# Patient Record
Sex: Female | Born: 1985 | Race: White | Hispanic: No | Marital: Married | Smoking: Never smoker
Health system: Southern US, Community
[De-identification: ages and names within clinical notes are randomized; demographics above are authoritative.]

## PROBLEM LIST (undated history)

## (undated) ENCOUNTER — Inpatient Hospital Stay: Payer: Self-pay

## (undated) DIAGNOSIS — D649 Anemia, unspecified: Secondary | ICD-10-CM

## (undated) DIAGNOSIS — A6009 Herpesviral infection of other urogenital tract: Secondary | ICD-10-CM

## (undated) DIAGNOSIS — E669 Obesity, unspecified: Secondary | ICD-10-CM

## (undated) HISTORY — DX: Herpesviral infection of other urogenital tract: A60.09

## (undated) HISTORY — DX: Anemia, unspecified: D64.9

## (undated) HISTORY — PX: NO PAST SURGERIES: SHX2092

---

## 2016-12-14 ENCOUNTER — Ambulatory Visit (INDEPENDENT_AMBULATORY_CARE_PROVIDER_SITE_OTHER): Payer: Managed Care, Other (non HMO) | Admitting: Certified Nurse Midwife

## 2016-12-14 ENCOUNTER — Encounter: Payer: Self-pay | Admitting: Certified Nurse Midwife

## 2016-12-14 VITALS — BP 105/64 | HR 78 | Ht 64.0 in | Wt 306.1 lb

## 2016-12-14 DIAGNOSIS — Z3202 Encounter for pregnancy test, result negative: Secondary | ICD-10-CM | POA: Diagnosis not present

## 2016-12-14 DIAGNOSIS — N926 Irregular menstruation, unspecified: Secondary | ICD-10-CM | POA: Diagnosis not present

## 2016-12-14 LAB — POCT URINE PREGNANCY: PREG TEST UR: NEGATIVE

## 2016-12-14 NOTE — Progress Notes (Signed)
Pt is here with c/o amenorrhea. Neg home pregnancy test. C/o breast heaviest and soreness. Denies N/V. LPS 8/17 WNL.

## 2016-12-14 NOTE — Patient Instructions (Signed)
Secondary Amenorrhea Secondary amenorrhea is the stopping of menstrual flow for 3-6 months in a female who has previously had periods. There are many possible causes. Most of these causes are not serious. Usually, treating the underlying problem causing the loss of menses will return your periods to normal. What are the causes? Some common and uncommon causes of not menstruating include:  Malnutrition.  Low blood sugar (hypoglycemia).  Polycystic ovary disease.  Stress or fear.  Breastfeeding.  Hormone imbalance.  Ovarian failure.  Medicines.  Extreme obesity.  Cystic fibrosis.  Low body weight or drastic weight reduction from any cause.  Early menopause.  Removal of ovaries or uterus.  Contraceptives.  Illness.  Long-term (chronic) illnesses.  Cushing syndrome.  Thyroid problems.  Birth control pills, patches, or vaginal rings for birth control.  What increases the risk? You may be at greater risk of secondary amenorrhea if:  You have a family history of this condition.  You have an eating disorder.  You do athletic training.  How is this diagnosed? A diagnosis is made by your health care provider taking a medical history and doing a physical exam. This will include a pelvic exam to check for problems with your reproductive organs. Pregnancy must be ruled out. Often, numerous blood tests are done to measure different hormones in the body. Urine testing may be done. Specialized exams (ultrasound, CT scan, MRI, or hysteroscopy) may have to be done as well as measuring the body mass index (BMI). How is this treated? Treatment depends on the cause of the amenorrhea. If an eating disorder is present, this can be treated with an adequate diet and therapy. Chronic illnesses may improve with treatment of the illness. Amenorrhea may be corrected with medicines, lifestyle changes, or surgery. If the amenorrhea cannot be corrected, it is sometimes possible to create a  false menstruation with medicines. Follow these instructions at home:  Maintain a healthy diet.  Manage weight problems.  Exercise regularly but not excessively.  Get adequate sleep.  Manage stress.  Be aware of changes in your menstrual cycle. Keep a record of when your periods occur. Note the date your period starts, how long it lasts, and any problems. Contact a health care provider if: Your symptoms do not get better with treatment. This information is not intended to replace advice given to you by your health care provider. Make sure you discuss any questions you have with your health care provider. Document Released: 08/13/2006 Document Revised: 12/08/2015 Document Reviewed: 12/18/2012 Elsevier Interactive Patient Education  2018 Elsevier Inc.  

## 2016-12-16 LAB — COMPREHENSIVE METABOLIC PANEL
A/G RATIO: 1.3 (ref 1.2–2.2)
ALT: 10 IU/L (ref 0–32)
AST: 15 IU/L (ref 0–40)
Albumin: 4 g/dL (ref 3.5–5.5)
Alkaline Phosphatase: 82 IU/L (ref 39–117)
BILIRUBIN TOTAL: 0.3 mg/dL (ref 0.0–1.2)
BUN/Creatinine Ratio: 21 (ref 9–23)
BUN: 15 mg/dL (ref 6–20)
CHLORIDE: 103 mmol/L (ref 96–106)
CO2: 25 mmol/L (ref 18–29)
Calcium: 9.1 mg/dL (ref 8.7–10.2)
Creatinine, Ser: 0.72 mg/dL (ref 0.57–1.00)
GFR calc non Af Amer: 114 mL/min/{1.73_m2} (ref >59)
GFR, EST AFRICAN AMERICAN: 131 mL/min/{1.73_m2} (ref >59)
GLUCOSE: 92 mg/dL (ref 65–99)
Globulin, Total: 3.1 g/dL (ref 1.5–4.5)
Potassium: 4.2 mmol/L (ref 3.5–5.2)
Sodium: 141 mmol/L (ref 134–144)
TOTAL PROTEIN: 7.1 g/dL (ref 6.0–8.5)

## 2016-12-16 LAB — TESTOSTERONE, FREE, TOTAL, SHBG
SEX HORMONE BINDING: 40.1 nmol/L (ref 24.6–122.0)
TESTOSTERONE FREE: 1.7 pg/mL (ref 0.0–4.2)
TESTOSTERONE: 19 ng/dL (ref 8–48)

## 2016-12-16 LAB — LIPID PANEL
Chol/HDL Ratio: 3.1 ratio (ref 0.0–4.4)
Cholesterol, Total: 166 mg/dL (ref 100–199)
HDL: 53 mg/dL (ref >39)
LDL Calculated: 96 mg/dL (ref 0–99)
Triglycerides: 86 mg/dL (ref 0–149)
VLDL Cholesterol Cal: 17 mg/dL (ref 5–40)

## 2016-12-16 LAB — HEMOGLOBIN A1C
ESTIMATED AVERAGE GLUCOSE: 108 mg/dL
HEMOGLOBIN A1C: 5.4 % (ref 4.8–5.6)

## 2016-12-16 LAB — BETA HCG QUANT (REF LAB): hCG Quant: <1 m[IU]/mL

## 2016-12-16 LAB — PROLACTIN: PROLACTIN: 12.6 ng/mL (ref 4.8–23.3)

## 2016-12-16 LAB — FSH/LH
FSH: 6.2 m[IU]/mL
LH: 6.9 m[IU]/mL

## 2016-12-16 LAB — INSULIN, RANDOM: INSULIN: 21.2 u[IU]/mL (ref 2.6–24.9)

## 2016-12-16 LAB — TSH: TSH: 1.64 u[IU]/mL (ref 0.450–4.500)

## 2016-12-16 NOTE — Progress Notes (Signed)
GYN ENCOUNTER NOTE  Subjective:       Nancy Sloan is a 31 y.o. G70P1001 female here for evaluation of missed menses with negative urine pregnancy test.   Nancy Sloan and her husband have been trying to get pregnancy for the last few months.   She endorses breast tenderness and back cramping, but had a negative home pregnancy test.   She started a low carbohydrate diet in January and lost 40 pounds, but has gained 10-15 pounds back in the last few months.   She believes she saw blood when wiping today after leaving her urine sample and thinks her cycle might be starting.   Denies difficulty breathing or respiratory distress, chest pain, headache, heat/cold intolerance, abdominal pain, dysuria, and leg pain or swelling.     Gynecologic History  Patient's last menstrual period was 09/20/2016 (exact date).  Contraception: none  Last Pap: 02/2016. Results were: normal  Menstrual History  Period Cycle (Days): 35-40 Period Duration (Days): 4-5 Period Pattern: Regular Menstrual Flow: Light, Moderate Dysmenorrhea: (!) Mild Dysmenorrhea Symptoms: start the week before  Obstetric History  OB History  Gravida Para Term Preterm AB Living  1 1 1     1   SAB TAB Ectopic Multiple Live Births          1    # Outcome Date GA Lbr Len/2nd Weight Sex Delivery Anes PTL Lv  1 Term 2013    F Vag-Spont  N LIV      History reviewed. No pertinent past medical history.  History reviewed. No pertinent surgical history.   Social History   Social History  . Marital status: Married    Spouse name: N/A  . Number of children: N/A  . Years of education: N/A   Occupational History  . Not on file.   Social History Main Topics  . Smoking status: Never Smoker  . Smokeless tobacco: Never Used  . Alcohol use Yes     Comment: occas  . Drug use: No  . Sexual activity: Yes    Birth control/ protection: None   Other Topics Concern  . Not on file   Social History Narrative  . No  narrative on file    Family History  Problem Relation Age of Onset  . Thyroid disease Mother   . Cancer - Cervical Mother     The following portions of the patient's history were reviewed and updated as appropriate: allergies, current medications, past family history, past medical history, past social history, past surgical history and problem list.  Review of Systems  Review of Systems - Negative except as noted above.  History obtained from the patient.  Objective:   BP 105/64   Pulse 78   Ht 5\' 4"  (1.626 m)   Wt (!) 306 lb 2 oz (138.9 kg)   LMP 09/20/2016 (Exact Date)   BMI 52.55 kg/m   Alert and oriented x 4, no apparent distress  Negative UPT  Physical exam: not indicated  Assessment:   1. Missed menses  - POCT urine pregnancy - Beta HCG, Quant - TSH - Prolactin - Comprehensive metabolic panel - Hemoglobin A1C - Lipid panel - Insulin, random - FSH/LH - Testosterone, Free, Total, SHBG  2. Urine pregnancy test negative  Plan:   Labs, see orders; Will contact pt via MyChart with results.   Reviewed red flag symptoms and when to call.   RTC as needed.    Gunnar Bulla, CNM  A total of 20 minutes were  spent face-to-face with the patient during the encounter with greater than 50% dealing with counseling and coordination of care.

## 2017-02-19 ENCOUNTER — Ambulatory Visit (INDEPENDENT_AMBULATORY_CARE_PROVIDER_SITE_OTHER): Payer: Managed Care, Other (non HMO)

## 2017-02-19 ENCOUNTER — Encounter: Payer: Self-pay | Admitting: Certified Nurse Midwife

## 2017-02-19 ENCOUNTER — Other Ambulatory Visit: Payer: Self-pay | Admitting: Certified Nurse Midwife

## 2017-02-19 ENCOUNTER — Ambulatory Visit (INDEPENDENT_AMBULATORY_CARE_PROVIDER_SITE_OTHER): Payer: Managed Care, Other (non HMO) | Admitting: Certified Nurse Midwife

## 2017-02-19 VITALS — BP 110/95 | HR 75 | Ht 64.0 in | Wt 307.9 lb

## 2017-02-19 DIAGNOSIS — N926 Irregular menstruation, unspecified: Secondary | ICD-10-CM

## 2017-02-19 DIAGNOSIS — Z3201 Encounter for pregnancy test, result positive: Secondary | ICD-10-CM

## 2017-02-19 LAB — POCT URINE PREGNANCY: PREG TEST UR: POSITIVE — AB

## 2017-02-19 NOTE — Patient Instructions (Signed)
First Trimester of Pregnancy The first trimester of pregnancy is from week 1 until the end of week 13 (months 1 through 3). A week after a sperm fertilizes an egg, the egg will implant on the wall of the uterus. This embryo will begin to develop into a baby. Genes from you and your partner will form the baby. The female genes will determine whether the baby will be a boy or a girl. At 6-8 weeks, the eyes and face will be formed, and the heartbeat can be seen on ultrasound. At the end of 12 weeks, all the baby's organs will be formed. Now that you are pregnant, you will want to do everything you can to have a healthy baby. Two of the most important things are to get good prenatal care and to follow your health care provider's instructions. Prenatal care is all the medical care you receive before the baby's birth. This care will help prevent, find, and treat any problems during the pregnancy and childbirth. Body changes during your first trimester Your body goes through many changes during pregnancy. The changes vary from woman to woman.  You may gain or lose a couple of pounds at first.  You may feel sick to your stomach (nauseous) and you may throw up (vomit). If the vomiting is uncontrollable, call your health care provider.  You may tire easily.  You may develop headaches that can be relieved by medicines. All medicines should be approved by your health care provider.  You may urinate more often. Painful urination may mean you have a bladder infection.  You may develop heartburn as a result of your pregnancy.  You may develop constipation because certain hormones are causing the muscles that push stool through your intestines to slow down.  You may develop hemorrhoids or swollen veins (varicose veins).  Your breasts may begin to grow larger and become tender. Your nipples may stick out more, and the tissue that surrounds them (areola) may become darker.  Your gums may bleed and may be  sensitive to brushing and flossing.  Dark spots or blotches (chloasma, mask of pregnancy) may develop on your face. This will likely fade after the baby is born.  Your menstrual periods will stop.  You may have a loss of appetite.  You may develop cravings for certain kinds of food.  You may have changes in your emotions from day to day, such as being excited to be pregnant or being concerned that something may go wrong with the pregnancy and baby.  You may have more vivid and strange dreams.  You may have changes in your hair. These can include thickening of your hair, rapid growth, and changes in texture. Some women also have hair loss during or after pregnancy, or hair that feels dry or thin. Your hair will most likely return to normal after your baby is born.  What to expect at prenatal visits During a routine prenatal visit:  You will be weighed to make sure you and the baby are growing normally.  Your blood pressure will be taken.  Your abdomen will be measured to track your baby's growth.  The fetal heartbeat will be listened to between weeks 10 and 14 of your pregnancy.  Test results from any previous visits will be discussed.  Your health care provider may ask you:  How you are feeling.  If you are feeling the baby move.  If you have had any abnormal symptoms, such as leaking fluid, bleeding, severe headaches,  or abdominal cramping.  If you are using any tobacco products, including cigarettes, chewing tobacco, and electronic cigarettes.  If you have any questions.  Other tests that may be performed during your first trimester include:  Blood tests to find your blood type and to check for the presence of any previous infections. The tests will also be used to check for low iron levels (anemia) and protein on red blood cells (Rh antibodies). Depending on your risk factors, or if you previously had diabetes during pregnancy, you may have tests to check for high blood  sugar that affects pregnant women (gestational diabetes).  Urine tests to check for infections, diabetes, or protein in the urine.  An ultrasound to confirm the proper growth and development of the baby.  Fetal screens for spinal cord problems (spina bifida) and Down syndrome.  HIV (human immunodeficiency virus) testing. Routine prenatal testing includes screening for HIV, unless you choose not to have this test.  You may need other tests to make sure you and the baby are doing well.  Follow these instructions at home: Medicines  Follow your health care provider's instructions regarding medicine use. Specific medicines may be either safe or unsafe to take during pregnancy.  Take a prenatal vitamin that contains at least 600 micrograms (mcg) of folic acid.  If you develop constipation, try taking a stool softener if your health care provider approves. Eating and drinking  Eat a balanced diet that includes fresh fruits and vegetables, whole grains, good sources of protein such as meat, eggs, or tofu, and low-fat dairy. Your health care provider will help you determine the amount of weight gain that is right for you.  Avoid raw meat and uncooked cheese. These carry germs that can cause birth defects in the baby.  Eating four or five small meals rather than three large meals a day may help relieve nausea and vomiting. If you start to feel nauseous, eating a few soda crackers can be helpful. Drinking liquids between meals, instead of during meals, also seems to help ease nausea and vomiting.  Limit foods that are high in fat and processed sugars, such as fried and sweet foods.  To prevent constipation: ? Eat foods that are high in fiber, such as fresh fruits and vegetables, whole grains, and beans. ? Drink enough fluid to keep your urine clear or pale yellow. Activity  Exercise only as directed by your health care provider. Most women can continue their usual exercise routine during  pregnancy. Try to exercise for 30 minutes at least 5 days a week. Exercising will help you: ? Control your weight. ? Stay in shape. ? Be prepared for labor and delivery.  Experiencing pain or cramping in the lower abdomen or lower back is a good sign that you should stop exercising. Check with your health care provider before continuing with normal exercises.  Try to avoid standing for long periods of time. Move your legs often if you must stand in one place for a long time.  Avoid heavy lifting.  Wear low-heeled shoes and practice good posture.  You may continue to have sex unless your health care provider tells you not to. Relieving pain and discomfort  Wear a good support bra to relieve breast tenderness.  Take warm sitz baths to soothe any pain or discomfort caused by hemorrhoids. Use hemorrhoid cream if your health care provider approves.  Rest with your legs elevated if you have leg cramps or low back pain.  If you develop  varicose veins in your legs, wear support hose. Elevate your feet for 15 minutes, 3-4 times a day. Limit salt in your diet. Prenatal care  Schedule your prenatal visits by the twelfth week of pregnancy. They are usually scheduled monthly at first, then more often in the last 2 months before delivery.  Write down your questions. Take them to your prenatal visits.  Keep all your prenatal visits as told by your health care provider. This is important. Safety  Wear your seat belt at all times when driving.  Make a list of emergency phone numbers, including numbers for family, friends, the hospital, and police and fire departments. General instructions  Ask your health care provider for a referral to a local prenatal education class. Begin classes no later than the beginning of month 6 of your pregnancy.  Ask for help if you have counseling or nutritional needs during pregnancy. Your health care provider can offer advice or refer you to specialists for help  with various needs.  Do not use hot tubs, steam rooms, or saunas.  Do not douche or use tampons or scented sanitary pads.  Do not cross your legs for long periods of time.  Avoid cat litter boxes and soil used by cats. These carry germs that can cause birth defects in the baby and possibly loss of the fetus by miscarriage or stillbirth.  Avoid all smoking, herbs, alcohol, and medicines not prescribed by your health care provider. Chemicals in these products affect the formation and growth of the baby.  Do not use any products that contain nicotine or tobacco, such as cigarettes and e-cigarettes. If you need help quitting, ask your health care provider. You may receive counseling support and other resources to help you quit.  Schedule a dentist appointment. At home, brush your teeth with a soft toothbrush and be gentle when you floss. Contact a health care provider if:  You have dizziness.  You have mild pelvic cramps, pelvic pressure, or nagging pain in the abdominal area.  You have persistent nausea, vomiting, or diarrhea.  You have a bad smelling vaginal discharge.  You have pain when you urinate.  You notice increased swelling in your face, hands, legs, or ankles.  You are exposed to fifth disease or chickenpox.  You are exposed to [place] measles (rubella) and have never had it. Get help right away if:  You have a fever.  You are leaking fluid from your vagina.  You have spotting or bleeding from your vagina.  You have severe abdominal cramping or pain.  You have rapid weight gain or loss.  You vomit blood or material that looks like coffee grounds.  You develop a severe headache.  You have shortness of breath.  You have any kind of trauma, such as from a fall or a car accident. Summary  The first trimester of pregnancy is from week 1 until the end of week 13 (months 1 through 3).  Your body goes through many changes during pregnancy. The changes vary from  woman to woman.  You will have routine prenatal visits. During those visits, your health care provider will examine you, discuss any test results you may have, and talk with you about how you are feeling. This information is not intended to replace advice given to you by your health care provider. Make sure you discuss any questions you have with your health care provider. Document Released: 06/26/2001 Document Revised: 06/13/2016 Document Reviewed: 06/13/2016 Elsevier Interactive Patient Education  2017 Elsevier  Inc.  Common Medications Safe in Pregnancy  Acne:      Constipation:  Benzoyl Peroxide     Colace  Clindamycin      Dulcolax Suppository  Topica Erythromycin     Fibercon  Salicylic Acid      Metamucil         Miralax AVOID:        Senakot   Accutane    Cough:  Retin-A       Cough Drops  Tetracycline      Phenergan w/ Codeine if Rx  Minocycline      Robitussin (Plain & DM)  Antibiotics:     Crabs/Lice:  Ceclor       RID  Cephalosporins    AVOID:  E-Mycins      Kwell  Keflex  Macrobid/Macrodantin   Diarrhea:  Penicillin      Kao-Pectate  Zithromax      Imodium AD         PUSH FLUIDS AVOID:       Cipro     Fever:  Tetracycline      Tylenol (Regular or Extra  Minocycline       Strength)  Levaquin      Extra Strength-Do not          Exceed 8 tabs/24 hrs Caffeine:        <282m/day (equiv. To 1 cup of coffee or  approx. 3 12 oz sodas)         Gas: Cold/Hayfever:       Gas-X  Benadryl      Mylicon  Claritin       Phazyme  **Claritin-D        Chlor-Trimeton    Headaches:  Dimetapp      ASA-Free Excedrin  Drixoral-Non-Drowsy     Cold Compress  Mucinex (Guaifenasin)     Tylenol (Regular or Extra  Sudafed/Sudafed-12 Hour     Strength)  **Sudafed PE Pseudoephedrine   Tylenol Cold & Sinus     Vicks Vapor Rub  Zyrtec  **AVOID if Problems With Blood Pressure         Heartburn: Avoid lying down for at least 1 hour after  meals  Aciphex      Maalox     Rash:  Milk of Magnesia     Benadryl    Mylanta       1% Hydrocortisone Cream  Pepcid  Pepcid Complete   Sleep Aids:  Prevacid      Ambien   Prilosec       Benadryl  Rolaids       Chamomile Tea  Tums (Limit 4/day)     Unisom  Zantac       Tylenol PM         Warm milk-add vanilla or  Hemorrhoids:       Sugar for taste  Anusol/Anusol H.C.  (RX: Analapram 2.5%)  Sugar Substitutes:  Hydrocortisone OTC     Ok in moderation  Preparation H      Tucks        Vaseline lotion applied to tissue with wiping    Herpes:     Throat:  Acyclovir      Oragel  Famvir  Valtrex     Vaccines:         Flu Shot Leg Cramps:       *Gardasil  Benadryl      Hepatitis A         Hepatitis B  Nasal Spray:       Pneumovax  Saline Nasal Spray     Polio Booster         Tetanus Nausea:       Tuberculosis test or PPD  Vitamin B6 25 mg TID   AVOID:    Dramamine      *Gardasil  Emetrol       Live Poliovirus  Ginger Root 250 mg QID    MMR (measles, mumps &  High Complex Carbs @ Bedtime    rebella)  Sea Bands-Accupressure    Varicella (Chickenpox)  Unisom 1/2 tab TID     *No known complications           If received before Pain:         Known pregnancy;   Darvocet       Resume series after  Lortab        Delivery  Percocet    Yeast:   Tramadol      Femstat  Tylenol 3      Gyne-lotrimin  Ultram       Monistat  Vicodin           MISC:         All Sunscreens           Hair Coloring/highlights          Insect Repellant's          (Including DEET)         Mystic Tans  Morning Sickness Morning sickness is when you feel sick to your stomach (nauseous) during pregnancy. This nauseous feeling may or may not come with vomiting. It often occurs in the morning but can be a problem any time of day. Morning sickness is most common during the first trimester, but it may continue throughout pregnancy. While morning sickness is unpleasant, it is usually harmless unless you develop  severe and continual vomiting (hyperemesis gravidarum). This condition requires more intense treatment. What are the causes? The cause of morning sickness is not completely known but seems to be related to normal hormonal changes that occur in pregnancy. What increases the risk? You are at greater risk if you:  Experienced nausea or vomiting before your pregnancy.  Had morning sickness during a previous pregnancy.  Are pregnant with more than one baby, such as twins.  How is this treated? Do not use any medicines (prescription, over-the-counter, or herbal) for morning sickness without first talking to your health care provider. Your health care provider may prescribe or recommend:  Vitamin B6 supplements.  Anti-nausea medicines.  The herbal medicine ginger.  Follow these instructions at home:  Only take over-the-counter or prescription medicines as directed by your health care provider.  Taking multivitamins before getting pregnant can prevent or decrease the severity of morning sickness in most women.  Eat a piece of dry toast or unsalted crackers before getting out of bed in the morning.  Eat five or six small meals a day.  Eat dry and bland foods (rice, baked potato). Foods high in carbohydrates are often helpful.  Do not drink liquids with your meals. Drink liquids between meals.  Avoid greasy, fatty, and spicy foods.  Get someone to cook for you if the smell of any food causes nausea and vomiting.  If you feel nauseous after taking prenatal vitamins, take the vitamins at night or with a snack.  Snack on protein foods (nuts, yogurt, cheese) between meals if you are hungry.  Eat unsweetened gelatins  for desserts.  Wearing an acupressure wristband (worn for sea sickness) may be helpful.  Acupuncture may be helpful.  Do not smoke.  Get a humidifier to keep the air in your house free of odors.  Get plenty of fresh air. Contact a health care provider if:  Your  home remedies are not working, and you need medicine.  You feel dizzy or lightheaded.  You are losing weight. Get help right away if:  You have persistent and uncontrolled nausea and vomiting.  You pass out (faint). This information is not intended to replace advice given to you by your health care provider. Make sure you discuss any questions you have with your health care provider. Document Released: 08/23/2006 Document Revised: 12/08/2015 Document Reviewed: 12/17/2012 Elsevier Interactive Patient Education  2017 [employer].

## 2017-02-20 ENCOUNTER — Ambulatory Visit (INDEPENDENT_AMBULATORY_CARE_PROVIDER_SITE_OTHER): Payer: Managed Care, Other (non HMO) | Admitting: Certified Nurse Midwife

## 2017-02-20 VITALS — BP 146/89 | HR 82 | Ht 64.0 in | Wt 310.2 lb

## 2017-02-20 DIAGNOSIS — Z6841 Body Mass Index (BMI) 40.0 and over, adult: Secondary | ICD-10-CM

## 2017-02-20 DIAGNOSIS — Z113 Encounter for screening for infections with a predominantly sexual mode of transmission: Secondary | ICD-10-CM

## 2017-02-20 DIAGNOSIS — Z3481 Encounter for supervision of other normal pregnancy, first trimester: Secondary | ICD-10-CM

## 2017-02-20 MED ORDER — PRENATABS RX 29-1 MG PO TABS: 1.0000 mg | ORAL_TABLET | Freq: Every day | ORAL | 3 refills | Status: DC

## 2017-02-20 NOTE — Progress Notes (Signed)
Nancy KelpJeannette Sloan presents for NOB nurse interview visit. Pregnancy confirmation done ___8/01/2017 with JML___.  G2- .  P1001- .  Dating scan done 02/19/2017. I am unable to view at this time. Per pt EDD 09/20/2017.  Pregnancy education material explained and given. __0_ cats in the home. NOB labs ordered. TSH/HbgA1c due to Increased BMI.  HIV labs and Drug screen were explained and rdered. PNV encouraged. Genetic screening options discussed. Genetic testing: Unsure.  Pt to discuss with provider. Per pt request pnv erx. Pt c/o of mild nausea. No meds needed at this time. 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 Pt. To follow up with provider in _3_ weeks for NOB physical.  All questions answered.

## 2017-02-20 NOTE — Patient Instructions (Signed)
First Trimester of Pregnancy The first trimester of pregnancy is from week 1 until the end of week 13 (months 1 through 3). A week after a sperm fertilizes an egg, the egg will implant on the wall of the uterus. This embryo will begin to develop into a baby. Genes from you and your partner will form the baby. The female genes will determine whether the baby will be a boy or a girl. At 6-8 weeks, the eyes and face will be formed, and the heartbeat can be seen on ultrasound. At the end of 12 weeks, all the baby's organs will be formed. Now that you are pregnant, you will want to do everything you can to have a healthy baby. Two of the most important things are to get good prenatal care and to follow your health care provider's instructions. Prenatal care is all the medical care you receive before the baby's birth. This care will help prevent, find, and treat any problems during the pregnancy and childbirth. Body changes during your first trimester Your body goes through many changes during pregnancy. The changes vary from woman to woman.  You may gain or lose a couple of pounds at first.  You may feel sick to your stomach (nauseous) and you may throw up (vomit). If the vomiting is uncontrollable, call your health care provider.  You may tire easily.  You may develop headaches that can be relieved by medicines. All medicines should be approved by your health care provider.  You may urinate more often. Painful urination may mean you have a bladder infection.  You may develop heartburn as a result of your pregnancy.  You may develop constipation because certain hormones are causing the muscles that push stool through your intestines to slow down.  You may develop hemorrhoids or swollen veins (varicose veins).  Your breasts may begin to grow larger and become tender. Your nipples may stick out more, and the tissue that surrounds them (areola) may become darker.  Your gums may bleed and may be  sensitive to brushing and flossing.  Dark spots or blotches (chloasma, mask of pregnancy) may develop on your face. This will likely fade after the baby is born.  Your menstrual periods will stop.  You may have a loss of appetite.  You may develop cravings for certain kinds of food.  You may have changes in your emotions from day to day, such as being excited to be pregnant or being concerned that something may go wrong with the pregnancy and baby.  You may have more vivid and strange dreams.  You may have changes in your hair. These can include thickening of your hair, rapid growth, and changes in texture. Some women also have hair loss during or after pregnancy, or hair that feels dry or thin. Your hair will most likely return to normal after your baby is born.  What to expect at prenatal visits During a routine prenatal visit:  You will be weighed to make sure you and the baby are growing normally.  Your blood pressure will be taken.  Your abdomen will be measured to track your baby's growth.  The fetal heartbeat will be listened to between weeks 10 and 14 of your pregnancy.  Test results from any previous visits will be discussed.  Your health care provider may ask you:  How you are feeling.  If you are feeling the baby move.  If you have had any abnormal symptoms, such as leaking fluid, bleeding, severe headaches,   or abdominal cramping.  If you are using any tobacco products, including cigarettes, chewing tobacco, and electronic cigarettes.  If you have any questions.  Other tests that may be performed during your first trimester include:  Blood tests to find your blood type and to check for the presence of any previous infections. The tests will also be used to check for low iron levels (anemia) and protein on red blood cells (Rh antibodies). Depending on your risk factors, or if you previously had diabetes during pregnancy, you may have tests to check for high blood  sugar that affects pregnant women (gestational diabetes).  Urine tests to check for infections, diabetes, or protein in the urine.  An ultrasound to confirm the proper growth and development of the baby.  Fetal screens for spinal cord problems (spina bifida) and Down syndrome.  HIV (human immunodeficiency virus) testing. Routine prenatal testing includes screening for HIV, unless you choose not to have this test.  You may need other tests to make sure you and the baby are doing well.  Follow these instructions at home: Medicines  Follow your health care provider's instructions regarding medicine use. Specific medicines may be either safe or unsafe to take during pregnancy.  Take a prenatal vitamin that contains at least 600 micrograms (mcg) of folic acid.  If you develop constipation, try taking a stool softener if your health care provider approves. Eating and drinking  Eat a balanced diet that includes fresh fruits and vegetables, whole grains, good sources of protein such as meat, eggs, or tofu, and low-fat dairy. Your health care provider will help you determine the amount of weight gain that is right for you.  Avoid raw meat and uncooked cheese. These carry germs that can cause birth defects in the baby.  Eating four or five small meals rather than three large meals a day may help relieve nausea and vomiting. If you start to feel nauseous, eating a few soda crackers can be helpful. Drinking liquids between meals, instead of during meals, also seems to help ease nausea and vomiting.  Limit foods that are high in fat and processed sugars, such as fried and sweet foods.  To prevent constipation: ? Eat foods that are high in fiber, such as fresh fruits and vegetables, whole grains, and beans. ? Drink enough fluid to keep your urine clear or pale yellow. Activity  Exercise only as directed by your health care provider. Most women can continue their usual exercise routine during  pregnancy. Try to exercise for 30 minutes at least 5 days a week. Exercising will help you: ? Control your weight. ? Stay in shape. ? Be prepared for labor and delivery.  Experiencing pain or cramping in the lower abdomen or lower back is a good sign that you should stop exercising. Check with your health care provider before continuing with normal exercises.  Try to avoid standing for long periods of time. Move your legs often if you must stand in one place for a long time.  Avoid heavy lifting.  Wear low-heeled shoes and practice good posture.  You may continue to have sex unless your health care provider tells you not to. Relieving pain and discomfort  Wear a good support bra to relieve breast tenderness.  Take warm sitz baths to soothe any pain or discomfort caused by hemorrhoids. Use hemorrhoid cream if your health care provider approves.  Rest with your legs elevated if you have leg cramps or low back pain.  If you develop   varicose veins in your legs, wear support hose. Elevate your feet for 15 minutes, 3-4 times a day. Limit salt in your diet. Prenatal care  Schedule your prenatal visits by the twelfth week of pregnancy. They are usually scheduled monthly at first, then more often in the last 2 months before delivery.  Write down your questions. Take them to your prenatal visits.  Keep all your prenatal visits as told by your health care provider. This is important. Safety  Wear your seat belt at all times when driving.  Make a list of emergency phone numbers, including numbers for family, friends, the hospital, and police and fire departments. General instructions  Ask your health care provider for a referral to a local prenatal education class. Begin classes no later than the beginning of month 6 of your pregnancy.  Ask for help if you have counseling or nutritional needs during pregnancy. Your health care provider can offer advice or refer you to specialists for help  with various needs.  Do not use hot tubs, steam rooms, or saunas.  Do not douche or use tampons or scented sanitary pads.  Do not cross your legs for long periods of time.  Avoid cat litter boxes and soil used by cats. These carry germs that can cause birth defects in the baby and possibly loss of the fetus by miscarriage or stillbirth.  Avoid all smoking, herbs, alcohol, and medicines not prescribed by your health care provider. Chemicals in these products affect the formation and growth of the baby.  Do not use any products that contain nicotine or tobacco, such as cigarettes and e-cigarettes. If you need help quitting, ask your health care provider. You may receive counseling support and other resources to help you quit.  Schedule a dentist appointment. At home, brush your teeth with a soft toothbrush and be gentle when you floss. Contact a health care provider if:  You have dizziness.  You have mild pelvic cramps, pelvic pressure, or nagging pain in the abdominal area.  You have persistent nausea, vomiting, or diarrhea.  You have a bad smelling vaginal discharge.  You have pain when you urinate.  You notice increased swelling in your face, hands, legs, or ankles.  You are exposed to fifth disease or chickenpox.  You are exposed to German measles (rubella) and have never had it. Get help right away if:  You have a fever.  You are leaking fluid from your vagina.  You have spotting or bleeding from your vagina.  You have severe abdominal cramping or pain.  You have rapid weight gain or loss.  You vomit blood or material that looks like coffee grounds.  You develop a severe headache.  You have shortness of breath.  You have any kind of trauma, such as from a fall or a car accident. Summary  The first trimester of pregnancy is from week 1 until the end of week 13 (months 1 through 3).  Your body goes through many changes during pregnancy. The changes vary from  woman to woman.  You will have routine prenatal visits. During those visits, your health care provider will examine you, discuss any test results you may have, and talk with you about how you are feeling. This information is not intended to replace advice given to you by your health care provider. Make sure you discuss any questions you have with your health care provider. Document Released: 06/26/2001 Document Revised: 06/13/2016 Document Reviewed: 06/13/2016 Elsevier Interactive Patient Education  2017 [employer] Commonly Asked Questions During Pregnancy  Cats: A parasite can be excreted in cat feces.  To avoid exposure you need to have another person empty the little box.  If you must empty the litter box you will need to wear gloves.  Wash your hands after handling your cat.  This parasite can also be found in raw or undercooked meat so this should also be avoided.  Colds, Sore Throats, Flu: Please check your medication sheet to see what you can take for symptoms.  If your symptoms are unrelieved by these medications please call the office.  Dental Work: Most any dental work Agricultural consultantyour dentist recommends is permitted.  X-rays should only be taken during the first trimester if absolutely necessary.  Your abdomen should be shielded with a lead apron during all x-rays.  Please notify your provider prior to receiving any x-rays.  Novocaine is fine; gas is not recommended.  If your dentist requires a note from us prior to dental work please call the office and we will provide one for you.  Exercise: Exercise is an important part of staying healthy during your pregnancy.  You may continue most exercises you were accustomed to prior to pregnancy.  Later in your pregnancy you will most likely notice you have difficulty with activities requiring balance like riding a bicycle.  It is important that you listen to your body and avoid activities that put you at a higher risk of falling.  Adequate rest and staying  well hydrated are a must!  If you have questions about the safety of specific activities ask your provider.    Exposure to Children with illness: Try to avoid obvious exposure; report any symptoms to us when noted,  If you have chicken pos, red measles or mumps, you should be immune to these diseases.   Please do not take any vaccines while pregnant unless you have checked with your OB provider.  Fetal Movement: After 28 weeks we recommend you do "kick counts" twice daily.  Lie or sit down in a calm quiet environment and count your baby movements "kicks".  You should feel your baby at least 10 times per hour.  If you have not felt 10 kicks within the first hour get up, walk around and have something sweet to eat or drink then repeat for an additional hour.  If count remains less than 10 per hour notify your provider.  Fumigating: Follow your pest control agent's advice as to how long to stay out of your home.  Ventilate the area well before re-entering.  Hemorrhoids:   Most over-the-counter preparations can be used during pregnancy.  Check your medication to see what is safe to use.  It is important to use a stool softener or fiber in your diet and to drink lots of liquids.  If hemorrhoids seem to be getting worse please call the office.   Hot Tubs:  Hot tubs Jacuzzis and saunas are not recommended while pregnant.  These increase your internal body temperature and should be avoided.  Intercourse:  Sexual intercourse is safe during pregnancy as long as you are comfortable, unless otherwise advised by your provider.  Spotting may occur after intercourse; report any bright red bleeding that is heavier than spotting.  Labor:  If you know that you are in labor, please go to the hospital.  If you are unsure, please call the office and let us help you decide what to do.  Lifting, straining, etc:  If your job requires  requires heavy lifting or straining please check with your provider for any limitations.   Generally, you should not lift items heavier than that you can lift simply with your hands and arms (no back muscles)  Painting:  Paint fumes do not harm your pregnancy, but may make you ill and should be avoided if possible.  Latex or water based paints have less odor than oils.  Use adequate ventilation while painting.  Permanents & Hair Color:  Chemicals in hair dyes are not recommended as they cause increase hair dryness which can increase hair loss during pregnancy.  " Highlighting" and permanents are allowed.  Dye may be absorbed differently and permanents may not hold as well during pregnancy.  Sunbathing:  Use a sunscreen, as skin burns easily during pregnancy.  Drink plenty of fluids; avoid over heating.  Tanning Beds:  Because their possible side effects are still unknown, tanning beds are not recommended.  Ultrasound Scans:  Routine ultrasounds are performed at approximately 20 weeks.  You will be able to see your baby's general anatomy an if you would like to know the gender this can usually be determined as well.  If it is questionable when you conceived you may also receive an ultrasound early in your pregnancy for dating purposes.  Otherwise ultrasound exams are not routinely performed unless there is a medical necessity.  Although you can request a scan we ask that you pay for it when conducted because insurance does not cover " patient request" scans.  Work: If your pregnancy proceeds without complications you may work until your due date, unless your physician or employer advises otherwise.  Round Ligament Pain/Pelvic Discomfort:  Sharp, shooting pains not associated with bleeding are fairly common, usually occurring in the second trimester of pregnancy.  They tend to be worse when standing up or when you remain standing for long periods of time.  These are the result of pressure of certain pelvic ligaments called "round ligaments".  Rest, Tylenol and heat seem to be the most  effective relief.  As the womb and fetus grow, they rise out of the pelvis and the discomfort improves.  Please notify the office if your pain seems different than that described.  It may represent a more serious condition.   

## 2017-02-21 LAB — URINALYSIS, ROUTINE W REFLEX MICROSCOPIC
Bilirubin, UA: NEGATIVE
Glucose, UA: NEGATIVE
Ketones, UA: NEGATIVE
Nitrite, UA: NEGATIVE
PH UA: 7 (ref 5.0–7.5)
PROTEIN UA: NEGATIVE
RBC, UA: NEGATIVE
Specific Gravity, UA: 1.007 (ref 1.005–1.030)
UUROB: 0.2 mg/dL (ref 0.2–1.0)

## 2017-02-21 LAB — CBC WITH DIFFERENTIAL/PLATELET
BASOS: 0 %
Basophils Absolute: 0 10*3/uL (ref 0.0–0.2)
EOS (ABSOLUTE): 0 10*3/uL (ref 0.0–0.4)
EOS: 0 %
HEMATOCRIT: 38.7 % (ref 34.0–46.6)
HEMOGLOBIN: 12.7 g/dL (ref 11.1–15.9)
IMMATURE GRANS (ABS): 0 10*3/uL (ref 0.0–0.1)
Immature Granulocytes: 0 %
LYMPHS: 32 %
Lymphocytes Absolute: 2.3 10*3/uL (ref 0.7–3.1)
MCH: 30.2 pg (ref 26.6–33.0)
MCHC: 32.8 g/dL (ref 31.5–35.7)
MCV: 92 fL (ref 79–97)
MONOCYTES: 4 %
Monocytes Absolute: 0.3 10*3/uL (ref 0.1–0.9)
NEUTROS ABS: 4.6 10*3/uL (ref 1.4–7.0)
Neutrophils: 64 %
Platelets: 252 10*3/uL (ref 150–379)
RBC: 4.21 x10E6/uL (ref 3.77–5.28)
RDW: 14 % (ref 12.3–15.4)
WBC: 7.3 10*3/uL (ref 3.4–10.8)

## 2017-02-21 LAB — NICOTINE SCREEN, URINE: Cotinine Ql Scrn, Ur: NEGATIVE ng/mL

## 2017-02-21 LAB — MICROSCOPIC EXAMINATION: CASTS: NONE SEEN /LPF

## 2017-02-21 LAB — RPR: RPR: NONREACTIVE

## 2017-02-21 LAB — DRUG PROFILE, UR, 9 DRUGS (LABCORP)
Amphetamines, Urine: NEGATIVE ng/mL
Barbiturate Quant, Ur: NEGATIVE ng/mL
Benzodiazepine Quant, Ur: NEGATIVE ng/mL
Cannabinoid Quant, Ur: NEGATIVE ng/mL
Cocaine (Metab.): NEGATIVE ng/mL
Methadone Screen, Urine: NEGATIVE ng/mL
Opiate Quant, Ur: NEGATIVE ng/mL
PCP Quant, Ur: NEGATIVE ng/mL
Propoxyphene: NEGATIVE ng/mL

## 2017-02-21 LAB — TSH: TSH: 1.52 u[IU]/mL (ref 0.450–4.500)

## 2017-02-21 LAB — HEMOGLOBIN A1C
Est. average glucose Bld gHb Est-mCnc: 94 mg/dL
Hgb A1c MFr Bld: 4.9 % (ref 4.8–5.6)

## 2017-02-21 LAB — ANTIBODY SCREEN: Antibody Screen: NEGATIVE

## 2017-02-21 LAB — "ABO AND RH ": Rh Factor: POSITIVE

## 2017-02-21 LAB — HEPATITIS B SURFACE ANTIGEN: HEP B S AG: NEGATIVE

## 2017-02-21 LAB — HIV ANTIBODY (ROUTINE TESTING W REFLEX): HIV SCREEN 4TH GENERATION: NONREACTIVE

## 2017-02-21 LAB — RUBELLA SCREEN

## 2017-02-21 LAB — VARICELLA ZOSTER ANTIBODY, IGG: Varicella zoster IgG: 948 index (ref >165)

## 2017-02-21 NOTE — Progress Notes (Signed)
I have reviewed the record and concur with patient management and plan of care.    Maliea Grandmaison Michelle Jermie Hippe, CNM [facility] 

## 2017-02-21 NOTE — Progress Notes (Signed)
GYN ENCOUNTER NOTE  Subjective:       Nancy Sloan is a 31 y.o. [id number] female here for gynecologic evaluation of the following issues: late menstrual period, hot flashes, dull abdominal cramping, fatigue, indigestion, bloating, and intermittent constipation for the last two (2) weeks.   No relief with home treatment measures.   Denies difficulty breathing or respiratory distress, chest pain, vaginal bleeding, dysuria, and leg pain or swelling.      Gynecologic History  Patient's last menstrual period was 12/14/2016 (exact date).  Contraception: none.   Estimated date of birth: 09/20/2016.  Gestational age: 13 weeks 4 days.   Last Pap: 02/2016. Results were: normal.   Obstetric History  OB History  Gravida Para Term Preterm AB Living  2 1 1     1   SAB TAB Ectopic Multiple Live Births          1    # Outcome Date GA Lbr Len/2nd Weight Sex Delivery Anes PTL Lv  2 Current           1 Term 2013    F Vag-Spont  N LIV      Past Medical History:  Diagnosis Date  . Anemia   . Herpes genitalis in women     Past Surgical History:  Procedure Laterality Date  . NO PAST SURGERIES      Current Outpatient Prescriptions on File Prior to Visit  Medication Sig Dispense Refill  . Prenatal Vit-Fe Fumarate-FA (PRENATAL MULTIVITAMIN) TABS tablet Take 1 tablet by mouth daily at 12 noon.     No current facility-administered medications on file prior to visit.     No Known Allergies  Social History   Social History  . Marital status: Married    Spouse name: N/A  . Number of children: N/A  . Years of education: N/A   Occupational History  . Not on file.   Social History Main Topics  . Smoking status: Never Smoker  . Smokeless tobacco: Never Used  . Alcohol use No     Comment: occas  . Drug use: No  . Sexual activity: Yes    Partners: Male    Birth control/ protection: None   Other Topics Concern  . Not on file   Social History Narrative  . No narrative on  file    Family History  Problem Relation Age of Onset  . Thyroid disease Mother   . Cancer - Cervical Mother   . Heart disease Mother   . Endometriosis Sister   . Breast cancer Neg Hx   . Ovarian cancer Neg Hx   . Colon cancer Neg Hx   . Diabetes Neg Hx     The following portions of the patient's history were reviewed and updated as appropriate: allergies, current medications, past family history, past medical history, past social history, past surgical history and problem list.  Review of Systems  Review of Systems - Negative except as noted above.  History obtained from the patient.  Objective:   BP (!) 110/95   Pulse 75   Ht 5\' 4"  (1.626 m)   Wt (!) 307 lb 14.4 oz (139.7 kg)   LMP 12/14/2016 (Exact Date)   BMI 52.85 kg/m    Alert and oriented x 4, no apparent distress  Abdomen: soft, round, obese, non-tender  Positive UPT  ULTRASOUND REPORT  Location: [facility] Date of Service: 02/19/17  Indications:Dating and viability Findings:  Singleton intrauterine pregnancy is visualized with a CRL consistent  with 9 2/[redacted] weeks gestation, giving an (U/S) EDD of 09/22/17. The (U/S) EDD is consistent with the clinically established (LMP) EDD of 09/20/17.  FHR: 152 CRL measurement: 25.7 mm Yolk sac and and early anatomy is normal.  Right Ovary measures 3.5 x 2.2 x 1.6 cm. It is normal in appearance. Left Ovary measures 3.1 x 2.1 x 2.3 cm. It is normal appearance. There is evidence of a corpus luteal cyst in the Right Survey of the adnexa demonstrates no adnexal masses. There is no free peritoneal fluid in the cul de sac.  Impression: 1. 9 2/7 week Viable Singleton Intrauterine pregnancy by U/S. 2. (U/S) EDD is consistent with Clinically established (LMP) EDD of 09/22/16.  Recommendations: 1.Clinical correlation with the patient's History and Physical Exam.  Assessment:   1. Missed menses  - POCT urine pregnancy  2. Positive pregnancy  test  Plan:   Dating and viability [place] today (results above)  Reviewed red flag symptoms and when to call.   RTC ASAP for nurse intake.   RTC x 3 weeks for NOB physical.    Gunnar Bulla, CNM

## 2017-02-22 LAB — URINE CULTURE, OB REFLEX

## 2017-02-22 LAB — CULTURE, OB URINE

## 2017-02-22 LAB — GC/CHLAMYDIA PROBE AMP
CHLAMYDIA, DNA PROBE: NEGATIVE
Neisseria gonorrhoeae by PCR: NEGATIVE

## 2017-02-27 ENCOUNTER — Encounter: Payer: Self-pay | Admitting: Certified Nurse Midwife

## 2017-02-28 ENCOUNTER — Encounter: Payer: Self-pay | Admitting: Certified Nurse Midwife

## 2017-03-06 ENCOUNTER — Encounter: Payer: Self-pay | Admitting: Certified Nurse Midwife

## 2017-03-12 ENCOUNTER — Encounter: Payer: Self-pay | Admitting: Certified Nurse Midwife

## 2017-03-12 ENCOUNTER — Other Ambulatory Visit: Payer: Managed Care, Other (non HMO)

## 2017-03-12 ENCOUNTER — Ambulatory Visit (INDEPENDENT_AMBULATORY_CARE_PROVIDER_SITE_OTHER): Payer: Managed Care, Other (non HMO) | Admitting: Certified Nurse Midwife

## 2017-03-12 VITALS — BP 95/71 | HR 92 | Wt 303.6 lb

## 2017-03-12 DIAGNOSIS — Z369 Encounter for antenatal screening, unspecified: Secondary | ICD-10-CM

## 2017-03-12 DIAGNOSIS — Z6841 Body Mass Index (BMI) 40.0 and over, adult: Secondary | ICD-10-CM

## 2017-03-12 DIAGNOSIS — Z3481 Encounter for supervision of other normal pregnancy, first trimester: Secondary | ICD-10-CM

## 2017-03-12 LAB — POCT URINALYSIS DIPSTICK
Bilirubin, UA: NEGATIVE
Blood, UA: NEGATIVE
Glucose, UA: NEGATIVE
Nitrite, UA: NEGATIVE
PH UA: 6 (ref 5.0–8.0)
PROTEIN UA: NEGATIVE
Spec Grav, UA: 1.01 (ref 1.010–1.025)
Urobilinogen, UA: 0.2 E.U./dL

## 2017-03-12 NOTE — Progress Notes (Signed)
NEW OB HISTORY AND PHYSICAL  SUBJECTIVE:       Nancy Sloan is a 31 y.o. G29P1001 female, Patient's last menstrual period was 12/14/2016 (exact date)., Estimated Date of Delivery: 09/20/17, [redacted]w[redacted]d, presents today for establishment of Prenatal Care.  She has no unusual complaints. Desires genetic screening.  Denies difficulty breathing or respiratory distress, chest pain, abdominal pain, vaginal bleeding, dysuria, change in vaginal discharge, and leg pain or swelling.    Gynecologic History  Patient's last menstrual period was 12/14/2016 (exact date).  Contraception: none  Last Pap: 02/2016. Results were: normal  Obstetric History  OB History  Gravida Para Term Preterm AB Living  2 1 1     1   SAB TAB Ectopic Multiple Live Births          1    # Outcome Date GA Lbr Len/2nd Weight Sex Delivery Anes PTL Lv  2 Current           1 Term 2013    F Vag-Spont  N LIV      Past Medical History:  Diagnosis Date  . Anemia   . Herpes genitalis in women     Past Surgical History:  Procedure Laterality Date  . NO PAST SURGERIES      Current Outpatient Prescriptions on File Prior to Visit  Medication Sig Dispense Refill  . Prenatal Vit-Fe Fumarate-FA (PRENATAL MULTIVITAMIN) TABS tablet Take 1 tablet by mouth daily at 12 noon.    . Prenatal Vit-Iron Carbonyl-FA (PRENATABS RX) 29-1 MG TABS Take 1 mg by mouth daily. 90 each 3   No current facility-administered medications on file prior to visit.     No Known Allergies  Social History   Social History  . Marital status: Married    Spouse name: N/A  . Number of children: N/A  . Years of education: N/A   Occupational History  . Not on file.   Social History Main Topics  . Smoking status: Never Smoker  . Smokeless tobacco: Never Used  . Alcohol use No     Comment: occas  . Drug use: No  . Sexual activity: Yes    Partners: Male    Birth control/ protection: None   Other Topics Concern  . Not on file   Social  History Narrative  . No narrative on file    Family History  Problem Relation Age of Onset  . Thyroid disease Mother   . Cancer - Cervical Mother   . Heart disease Mother   . Endometriosis Sister   . Breast cancer Neg Hx   . Ovarian cancer Neg Hx   . Colon cancer Neg Hx   . Diabetes Neg Hx     The following portions of the patient's history were reviewed and updated as appropriate: allergies, current medications, past OB history, past medical history, past surgical history, past family history, past social history, and problem list.    OBJECTIVE:  Initial Physical Exam (New OB)  GENERAL APPEARANCE: alert, well appearing, in no apparent distress  HEAD: normocephalic, atraumatic  MOUTH: mucous membranes moist, pharynx normal without lesions and dental hygiene good  THYROID: no thyromegaly or masses present  BREASTS: no masses noted, no significant tenderness, no palpable axillary nodes, no skin changes  LUNGS: clear to auscultation, no wheezes, rales or rhonchi, symmetric air entry  HEART: regular rate and rhythm, no murmurs  ABDOMEN: soft, nontender, nondistended, no abnormal masses, no epigastric pain, obese, fundus not palpable and FHT present  EXTREMITIES: no  redness or tenderness in the calves or thighs, no edema  SKIN: normal coloration and turgor, no rashes  LYMPH NODES: no adenopathy palpable  NEUROLOGIC: alert, oriented, normal speech, no focal findings or movement disorder noted  PELVIC EXAM EXTERNAL GENITALIA: normal appearing vulva with no masses, tenderness or lesions VAGINA: no abnormal discharge or lesions CERVIX: no lesions or cervical motion tenderness OB EXAM PELVIMETRY: appears adequate  ASSESSMENT:  Normal pregnancy Maternal HSV 2 Desires genetic screening BMI >52  PLAN: Prenatal care Panorama today New OB counseling: The patient has been given an overview regarding routine prenatal care. Recommendations regarding diet, weight gain,  and exercise in pregnancy were given. Prenatal testing, optional genetic testing, and ultrasound use in pregnancy were reviewed.  Benefits of Breast Feeding were discussed. The patient is encouraged to consider nursing her baby post partum. Referral to tertiary care center for maternal obesity.  RTC x 4 weeks for glucola and ROB. Reviewed red flag symptoms and when to call.  See orders   Gunnar Bulla, CNM

## 2017-03-22 ENCOUNTER — Telehealth: Payer: Self-pay

## 2017-03-22 ENCOUNTER — Encounter: Payer: Self-pay | Admitting: Certified Nurse Midwife

## 2017-03-22 NOTE — Telephone Encounter (Signed)
Spoke with pt- Panorama results were insufficient fetal DNA. Informed pt. She is scheduled for lab work on 04/09/17 at which time blood for Panorama will be drawn per pt request. She chose that test instead of MaterniT because of the cost.

## 2017-04-09 ENCOUNTER — Other Ambulatory Visit: Payer: Managed Care, Other (non HMO)

## 2017-04-09 ENCOUNTER — Encounter: Payer: Self-pay | Admitting: Certified Nurse Midwife

## 2017-04-09 ENCOUNTER — Ambulatory Visit (INDEPENDENT_AMBULATORY_CARE_PROVIDER_SITE_OTHER): Payer: Managed Care, Other (non HMO) | Admitting: Certified Nurse Midwife

## 2017-04-09 ENCOUNTER — Other Ambulatory Visit: Payer: Self-pay | Admitting: Certified Nurse Midwife

## 2017-04-09 VITALS — BP 102/71 | HR 87 | Wt 304.6 lb

## 2017-04-09 DIAGNOSIS — Z6841 Body Mass Index (BMI) 40.0 and over, adult: Secondary | ICD-10-CM

## 2017-04-09 DIAGNOSIS — Z3481 Encounter for supervision of other normal pregnancy, first trimester: Secondary | ICD-10-CM

## 2017-04-09 DIAGNOSIS — Z3482 Encounter for supervision of other normal pregnancy, second trimester: Secondary | ICD-10-CM

## 2017-04-09 LAB — POCT URINALYSIS DIPSTICK
BILIRUBIN UA: NEGATIVE
Ketones, UA: NEGATIVE
LEUKOCYTES UA: NEGATIVE
NITRITE UA: NEGATIVE
Protein, UA: NEGATIVE
RBC UA: NEGATIVE
Spec Grav, UA: 1.01 (ref 1.010–1.025)
UROBILINOGEN UA: 0.2 U/dL
pH, UA: 6 (ref 5.0–8.0)

## 2017-04-09 NOTE — Progress Notes (Addendum)
ROB, doing well. Fundal height difficult to measure due to body habitus. Fetal heart tones difficult to find due to maternal body habitus. 1 hr early GTT today. Panormama testing inconclusive. Second trimester screen today. Follow up 4 wks for anatomy scan.   Doreene Burke, CNM

## 2017-04-09 NOTE — Patient Instructions (Signed)

## 2017-04-10 LAB — GLUCOSE TOLERANCE, 1 HOUR: Glucose, 1Hr PP: 150 mg/dL (ref 65–199)

## 2017-04-11 LAB — AFP TETRA
DIA Mom Value: 1.92
DIA Value (EIA): 219.22 pg/mL
DSR (BY AGE) 1 IN: 648
DSR (Second Trimester) 1 IN: 162
Gestational Age: 16.6 WEEKS
MATERNAL AGE AT EDD: 30.6 a
MSAFP MOM: 0.73
MSAFP: 17.5 ng/mL
MSHCG MOM: 1.84
MSHCG: 40546 m[IU]/mL
Osb Risk: 10000
T18 (By Age): 1:2523 {titer}
TEST RESULTS AFP: POSITIVE — AB
UE3 VALUE: 0.71 ng/mL
Weight: 304 [lb_av]
uE3 Mom: 0.93

## 2017-04-11 NOTE — Progress Notes (Signed)
Hey, Ive got Nancy Sloan scheduled for her 3 hr glucose Oct. 2nd, 2018 at 8:30a. Thank you.

## 2017-04-15 ENCOUNTER — Encounter: Payer: Self-pay | Admitting: Certified Nurse Midwife

## 2017-04-15 ENCOUNTER — Other Ambulatory Visit: Payer: Self-pay | Admitting: Certified Nurse Midwife

## 2017-04-15 DIAGNOSIS — O285 Abnormal chromosomal and genetic finding on antenatal screening of mother: Secondary | ICD-10-CM

## 2017-04-15 NOTE — Progress Notes (Signed)
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   Orders placed for MFM referral.   Doreene Burke, CNM

## 2017-04-16 ENCOUNTER — Other Ambulatory Visit: Payer: Managed Care, Other (non HMO)

## 2017-04-16 DIAGNOSIS — R7309 Other abnormal glucose: Secondary | ICD-10-CM

## 2017-04-17 ENCOUNTER — Encounter: Payer: Self-pay | Admitting: Certified Nurse Midwife

## 2017-04-17 LAB — GESTATIONAL GLUCOSE TOLERANCE
Glucose, Fasting: 89 mg/dL (ref 65–94)
Glucose, GTT - 1 Hour: 164 mg/dL (ref 65–179)
Glucose, GTT - 2 Hour: 121 mg/dL (ref 65–154)
Glucose, GTT - 3 Hour: 117 mg/dL (ref 65–139)

## 2017-04-19 DIAGNOSIS — O99210 Obesity complicating pregnancy, unspecified trimester: Secondary | ICD-10-CM | POA: Insufficient documentation

## 2017-04-19 DIAGNOSIS — O09899 Supervision of other high risk pregnancies, unspecified trimester: Secondary | ICD-10-CM | POA: Insufficient documentation

## 2017-04-19 DIAGNOSIS — O0990 Supervision of high risk pregnancy, unspecified, unspecified trimester: Secondary | ICD-10-CM | POA: Insufficient documentation

## 2017-04-29 ENCOUNTER — Other Ambulatory Visit: Payer: Self-pay | Admitting: Obstetrics and Gynecology

## 2017-04-29 DIAGNOSIS — Z3689 Encounter for other specified antenatal screening: Secondary | ICD-10-CM

## 2017-05-01 ENCOUNTER — Telehealth: Payer: Self-pay | Admitting: Obstetrics and Gynecology

## 2017-05-02 NOTE — Telephone Encounter (Signed)
error 

## 2017-05-07 ENCOUNTER — Encounter: Payer: Managed Care, Other (non HMO) | Admitting: Obstetrics and Gynecology

## 2017-05-07 ENCOUNTER — Other Ambulatory Visit: Payer: Managed Care, Other (non HMO)

## 2017-05-09 ENCOUNTER — Ambulatory Visit: Payer: Self-pay

## 2017-05-09 ENCOUNTER — Ambulatory Visit: Admission: RE | Admit: 2017-05-09 | Payer: Self-pay | Source: Ambulatory Visit

## 2017-12-13 ENCOUNTER — Encounter (HOSPITAL_COMMUNITY): Payer: Self-pay

## 2019-05-25 ENCOUNTER — Ambulatory Visit (INDEPENDENT_AMBULATORY_CARE_PROVIDER_SITE_OTHER): Payer: Managed Care, Other (non HMO) | Admitting: Certified Nurse Midwife

## 2019-05-25 ENCOUNTER — Encounter: Payer: Managed Care, Other (non HMO) | Admitting: Certified Nurse Midwife

## 2019-05-25 ENCOUNTER — Other Ambulatory Visit: Payer: Self-pay

## 2019-05-25 ENCOUNTER — Encounter: Payer: Self-pay | Admitting: Certified Nurse Midwife

## 2019-05-25 ENCOUNTER — Other Ambulatory Visit (HOSPITAL_COMMUNITY)
Admission: RE | Admit: 2019-05-25 | Discharge: 2019-05-25 | Disposition: A | Discharge status: Home / Self Care | Payer: Managed Care, Other (non HMO) | Source: Ambulatory Visit | Attending: Certified Nurse Midwife | Admitting: Certified Nurse Midwife

## 2019-05-25 VITALS — BP 128/94 | HR 94 | Ht 64.0 in | Wt 321.0 lb

## 2019-05-25 DIAGNOSIS — N939 Abnormal uterine and vaginal bleeding, unspecified: Secondary | ICD-10-CM | POA: Diagnosis present

## 2019-05-25 DIAGNOSIS — N926 Irregular menstruation, unspecified: Secondary | ICD-10-CM

## 2019-05-25 LAB — POCT URINE PREGNANCY: Preg Test, Ur: NEGATIVE

## 2019-05-25 NOTE — Progress Notes (Signed)
Patient c/o irregular menstrual periods "for years", LMP was ~01/14/19, currently spotting intermittently x1 month.

## 2019-05-25 NOTE — Progress Notes (Signed)
GYN ENCOUNTER NOTE  Subjective:       Nancy Sloan is a 33 y.o. G18P2002 female is here for gynecologic evaluation of the following issues:   1. Intermittent irregular vaginal spotting for the last month; history of irregular menses for years  Denies difficulty breathing or respiratory distress, chest pain, abdominal pain, excessive vaginal bleeding, dysuria, and leg pain or swelling.      Gynecologic History  Patient's last menstrual period was 01/14/2019 (approximate). Period Pattern: (!) Irregular  Contraception: condoms  Last Pap: 2018. Results were: normal per patient  Obstetric History  OB History  Gravida Para Term Preterm AB Living  2 2 2     2   SAB TAB Ectopic Multiple Live Births          2    # Outcome Date GA Lbr Len/2nd Weight Sex Delivery Anes PTL Lv  2 Term 09/17/17 [redacted]w[redacted]d  7 lb 2 oz (3.232 kg) F Vag-Spont EPI N LIV  1 Term 10/18/11   6 lb (2.722 kg) F Vag-Spont EPI N LIV    Past Medical History:  Diagnosis Date  . Anemia   . Herpes genitalis in women     Past Surgical History:  Procedure Laterality Date  . NO PAST SURGERIES     No Known Allergies  Social History   Socioeconomic History  . Marital status: Married    Spouse name: Not on file  . Number of children: Not on file  . Years of education: Not on file  . Highest education level: Not on file  Occupational History  . Not on file  Social Needs  . Financial resource strain: Not on file  . Food insecurity    Worry: Not on file    Inability: Not on file  . Transportation needs    Medical: Not on file    Non-medical: Not on file  Tobacco Use  . Smoking status: Never Smoker  . Smokeless tobacco: Never Used  Substance and Sexual Activity  . Alcohol use: Yes    Frequency: Never    Comment: rare  . Drug use: No  . Sexual activity: Yes    Partners: Male    Birth control/protection: Condom  Lifestyle  . Physical activity    Days per week: Not on file    Minutes per session: Not  on file  . Stress: Not on file  Relationships  . Social Herbalist on phone: Not on file    Gets together: Not on file    Attends religious service: Not on file    Active member of club or organization: Not on file    Attends meetings of clubs or organizations: Not on file    Relationship status: Not on file  . Intimate partner violence    Fear of current or ex partner: Not on file    Emotionally abused: Not on file    Physically abused: Not on file    Forced sexual activity: Not on file  Other Topics Concern  . Not on file  Social History Narrative  . Not on file    Family History  Problem Relation Age of Onset  . Thyroid disease Mother   . Cancer - Cervical Mother   . Heart disease Mother   . Endometriosis Sister   . Breast cancer Neg Hx   . Ovarian cancer Neg Hx   . Colon cancer Neg Hx   . Diabetes Neg Hx     The  following portions of the patient's history were reviewed and updated as appropriate: allergies, current medications, past family history, past medical history, past social history, past surgical history and problem list.  Review of Systems  ROS negative except as noted above. Information obtained from patient.   Objective:   BP (!) 128/94   Pulse 94   Ht 5\' 4"  (1.626 m)   Wt (!) 321 lb (145.6 kg)   LMP 01/14/2019 (Approximate)   Breastfeeding No   BMI 55.10 kg/m    CONSTITUTIONAL: Well-developed, well-nourished female in no acute distress.   PELVIC:  External Genitalia: Normal  Vagina: Normal, Vaginal swab collected  Cervix: Normal   MUSCULOSKELETAL: Normal range of motion. No tenderness.  No cyanosis, clubbing, or edema.  Assessment:   1. Irregular menses  - POCT urine pregnancy - CBC - Estradiol - Ferritin - FSH/LH - TSH - 03/17/2019 GYN Transvaginal; Future  2. Abnormal uterine bleeding  - CBC - Estradiol - Ferritin - FSH/LH - TSH - Cervicovaginal ancillary only - [place] GYN Transvaginal; Future  3. Vaginal spotting  -  CBC - Estradiol - Ferritin - FSH/LH - TSH - Cervicovaginal ancillary only - [place] GYN Transvaginal; Future     Plan:   Vaginal swab and labs collected, see orders.   Discussed possible differential diagnosis.   Reviewed red flag symptoms and when to call.   RTC for ultrasound and results review.   [place], CNM [facility], [facility]

## 2019-05-25 NOTE — Patient Instructions (Signed)
Abnormal Uterine Bleeding °Abnormal uterine bleeding means bleeding more than usual from your uterus. It can include: °· Bleeding between periods. °· Bleeding after sex. °· Bleeding that is heavier than normal. °· Periods that last longer than usual. °· Bleeding after you have stopped having your period (menopause). °There are many problems that may cause this. You should see a doctor for any kind of bleeding that is not normal. Treatment depends on the cause of the bleeding. °Follow these instructions at home: °· Watch your condition for any changes. °· Do not use tampons, douche, or have sex, if your doctor tells you not to. °· Change your pads often. °· Get regular well-woman exams. Make sure they include a pelvic exam and cervical cancer screening. °· Keep all follow-up visits as told by your doctor. This is important. °Contact a doctor if: °· The bleeding lasts more than one week. °· You feel dizzy at times. °· You feel like you are going to throw up (nauseous). °· You throw up. °Get help right away if: °· You pass out. °· You have to change pads every hour. °· You have belly (abdominal) pain. °· You have a fever. °· You get sweaty. °· You get weak. °· You passing large blood clots from your vagina. °Summary °· Abnormal uterine bleeding means bleeding more than usual from your uterus. °· There are many problems that may cause this. You should see a doctor for any kind of bleeding that is not normal. °· Treatment depends on the cause of the bleeding. °This information is not intended to replace advice given to you by your health care provider. Make sure you discuss any questions you have with your health care provider. °Document Released: 04/29/2009 Document Revised: 06/26/2016 Document Reviewed: 06/26/2016 °Elsevier Patient Education © 2020 Elsevier Inc. ° °

## 2019-05-26 ENCOUNTER — Other Ambulatory Visit: Payer: Managed Care, Other (non HMO)

## 2019-05-26 ENCOUNTER — Encounter: Payer: Managed Care, Other (non HMO) | Admitting: Obstetrics and Gynecology

## 2019-05-26 LAB — CBC
Hematocrit: 38 % (ref 34.0–46.6)
Hemoglobin: 12.9 g/dL (ref 11.1–15.9)
MCH: 29.6 pg (ref 26.6–33.0)
MCHC: 33.9 g/dL (ref 31.5–35.7)
MCV: 87 fL (ref 79–97)
Platelets: 256 10*3/uL (ref 150–450)
RBC: 4.36 x10E6/uL (ref 3.77–5.28)
RDW: 13.1 % (ref 11.7–15.4)
WBC: 8.7 10*3/uL (ref 3.4–10.8)

## 2019-05-26 LAB — BETA HCG QUANT (REF LAB): hCG Quant: <1 m[IU]/mL

## 2019-05-26 LAB — FSH/LH
FSH: 10.1 m[IU]/mL
LH: 19.6 m[IU]/mL

## 2019-05-26 LAB — ESTRADIOL: Estradiol: 47.4 pg/mL

## 2019-05-26 LAB — FERRITIN: Ferritin: 109 ng/mL (ref 15–150)

## 2019-05-26 LAB — TSH: TSH: 1.56 u[IU]/mL (ref 0.450–4.500)

## 2019-05-27 LAB — CERVICOVAGINAL ANCILLARY ONLY
Bacterial Vaginitis (gardnerella): NEGATIVE
Candida Glabrata: NEGATIVE
Candida Vaginitis: NEGATIVE
Comment: NEGATIVE
Comment: NEGATIVE
Comment: NEGATIVE
Comment: NEGATIVE
Trichomonas: NEGATIVE

## 2019-06-03 NOTE — Progress Notes (Signed)
GYN ENCOUNTER NOTE  Subjective:       Nancy Sloan is a 33 y.o. 432P2002 female here of review of ultrasound and lab results.   Last seen in office on 05/25/2019 for evaluation of abnormal uterine bleeding.   Denies difficulty breathing or respiratory distress, chest pain, abdominal pain, excessive vaginal bleeding, dysuria, and leg pain or swelling.    Gynecologic History  No LMP recorded. (Menstrual status: Irregular Periods).  Contraception: condoms  Last Pap: 2018. Results were: normal per patient  Obstetric History  OB History  Gravida Para Term Preterm AB Living  2 2 2     2   SAB TAB Ectopic Multiple Live Births          2    # Outcome Date GA Lbr Len/2nd Weight Sex Delivery Anes PTL Lv  2 Term 09/17/17 1962w4d  7 lb 2 oz (3.232 kg) F Vag-Spont EPI N LIV  1 Term 10/18/11   6 lb (2.722 kg) F Vag-Spont EPI N LIV    Past Medical History:  Diagnosis Date  . Anemia   . Herpes genitalis in women     Past Surgical History:  Procedure Laterality Date  . NO PAST SURGERIES      No Known Allergies  Social History   Socioeconomic History  . Marital status: Married    Spouse name: Not on file  . Number of children: Not on file  . Years of education: Not on file  . Highest education level: Not on file  Occupational History  . Not on file  Social Needs  . Financial resource strain: Not on file  . Food insecurity    Worry: Not on file    Inability: Not on file  . Transportation needs    Medical: Not on file    Non-medical: Not on file  Tobacco Use  . Smoking status: Never Smoker  . Smokeless tobacco: Never Used  Substance and Sexual Activity  . Alcohol use: Yes    Frequency: Never    Comment: rare  . Drug use: No  . Sexual activity: Yes    Partners: Male    Birth control/protection: Condom  Lifestyle  . Physical activity    Days per week: Not on file    Minutes per session: Not on file  . Stress: Not on file  Relationships  . Social Wellsite geologistconnections     Talks on phone: Not on file    Gets together: Not on file    Attends religious service: Not on file    Active member of club or organization: Not on file    Attends meetings of clubs or organizations: Not on file    Relationship status: Not on file  . Intimate partner violence    Fear of current or ex partner: Not on file    Emotionally abused: Not on file    Physically abused: Not on file    Forced sexual activity: Not on file  Other Topics Concern  . Not on file  Social History Narrative  . Not on file    Family History  Problem Relation Age of Onset  . Thyroid disease Mother   . Cancer - Cervical Mother   . Heart disease Mother   . Endometriosis Sister   . Breast cancer Neg Hx   . Ovarian cancer Neg Hx   . Colon cancer Neg Hx   . Diabetes Neg Hx     The following portions of the patient's history were reviewed  and updated as appropriate: allergies, current medications, past family history, past medical history, past social history, past surgical history and problem list.  Review of Systems  ROS negative except as noted above. Information obtained from patient.   Objective:   BP 119/88   Pulse 95   Ht  (1.626 m)   Wt (!) 321 lb 12.8 oz (146 kg)   LMP 01/14/2019 (Exact Date)   BMI 55.24 kg/m    CONSTITUTIONAL: Well-developed, well-nourished female in no acute distress.   PHYSICAL EXAM: Not indicated.   Recent Results (from the past 2160 hour(s))  POCT urine pregnancy     Status: None   Collection Time: 05/25/19  2:25 PM  Result Value Ref Range   Preg Test, Ur Negative Negative  Cervicovaginal ancillary only     Status: None   Collection Time: 05/25/19  2:27 PM  Result Value Ref Range   Trichomonas Negative    Bacterial Vaginitis (gardnerella) Negative    Candida Vaginitis Negative    Candida Glabrata Negative    Comment      Normal Reference Range Bacterial Vaginosis - Negative   Comment Normal Reference Range Candida Species - Negative     Comment Normal Reference Range Candida Galbrata - Negative    Comment Normal Reference Range Trichomonas - Negative   CBC     Status: None   Collection Time: 05/25/19  2:52 PM  Result Value Ref Range   WBC 8.7 3.4 - 10.8 x10E3/uL   RBC 4.36 3.77 - 5.28 x10E6/uL   Hemoglobin 12.9 11.1 - 15.9 g/dL   Hematocrit 02.7 25.3 - 46.6 %   MCV 87 79 - 97 fL   MCH 29.6 26.6 - 33.0 pg   MCHC 33.9 31.5 - 35.7 g/dL   RDW 66.4 40.3 - 47.4 %   Platelets 256 150 - 450 x10E3/uL  Estradiol     Status: None   Collection Time: 05/25/19  2:52 PM  Result Value Ref Range   Estradiol 47.4 pg/mL    Comment:                     Adult Female:                       Follicular phase   12.5 -   166.0                       Ovulation phase    85.8 -   498.0                       Luteal phase       43.8 -   211.0                       Postmenopausal     <6.0 -    54.7                     Pregnancy                       1st trimester     215.0 - >4300.0 Roche ECLIA methodology   Ferritin     Status: None   Collection Time: 05/25/19  2:52 PM  Result Value Ref Range   Ferritin 109 15 - 150 ng/mL  FSH/LH     Status: None   Collection Time:  05/25/19  2:52 PM  Result Value Ref Range   LH 19.6 mIU/mL    Comment:                     Adult Female:                       Follicular phase      2.4 -  12.6                       Ovulation phase      14.0 -  95.6                       Luteal phase          1.0 -  11.4                       Postmenopausal        7.7 -  58.5    FSH 10.1 mIU/mL    Comment:                     Adult Female:                       Follicular phase      3.5 -  12.5                       Ovulation phase       4.7 -  21.5                       Luteal phase          1.7 -   7.7                       Postmenopausal       25.8 - 134.8   TSH     Status: None   Collection Time: 05/25/19  2:52 PM  Result Value Ref Range   TSH 1.560 0.450 - 4.500 uIU/mL  HCG, Tumor Marker-new beta     Status:  None   Collection Time: 05/25/19  2:52 PM  Result Value Ref Range   hCG Quant <1 mIU/mL    Comment:                      Female (Non-pregnant)    0 -     5                             (Postmenopausal)  0 -     8                      Female (Pregnant)                      Weeks of Gestation                              3                6 -    34  4               10 -   750                              5              217 -  7138                              6              158 - 31795                              7             [id number]                              8            32065 -[id number]                              9            63803 -[id number]                             10            46509 -[id number]                             12            27832 -[id number]                             14            13950 - 62530                             15            12039 - 70971                             16             9040 - 56451                             17             [phone] [phone] Roche E CLIA methodology    ULTRASOUND REPORT  Location: [facility]  Date of Service: 06/04/2019   Indications:AUB Findings:  The uterus is anteverted and measures 8.5 x 4.9 x 5.1. Echo texture is heterogenous with evidence of focal masses.  The Endometrium measures 11 mm.  Right Ovary measures 3.1 x  2.4 x 2.5 cm. It is normal in appearance.Rt dominate follicle measuring 1.3 cm. Left Ovary measures 2.5 x 2.3 x 2.3 cm. It is normal in appearance. Survey of the adnexa demonstrates no adnexal masses. There is no free fluid in the cul de sac.  Impression: 1. Rt ovarian dominate follicle measuring 1.3 cm.  Recommendations: 1.Clinical correlation with the patient's History and Physical Exam.  Assessment:   1. Irregular menses   2. Abnormal uterine bleeding   3. Vaginal spotting   4. Follow up   Plan:    Results reviewed with patient, verbalized understanding.   Discussed cycle management options. Declines OCPs at this time.   Rx: Provera, see orders.   Reviewed red flag symptoms and when to call.   RTC for follow up or sooner if needed.    Gunnar Bulla, CNM [facility], [facility] 06/04/19 5:14 PM    Gunnar Bulla, CNM [facility], [facility] 06/03/19 8:56 PM

## 2019-06-04 ENCOUNTER — Encounter: Payer: Self-pay | Admitting: Certified Nurse Midwife

## 2019-06-04 ENCOUNTER — Ambulatory Visit (INDEPENDENT_AMBULATORY_CARE_PROVIDER_SITE_OTHER): Payer: Managed Care, Other (non HMO) | Admitting: Certified Nurse Midwife

## 2019-06-04 ENCOUNTER — Other Ambulatory Visit: Payer: Self-pay

## 2019-06-04 ENCOUNTER — Ambulatory Visit (INDEPENDENT_AMBULATORY_CARE_PROVIDER_SITE_OTHER): Payer: Managed Care, Other (non HMO)

## 2019-06-04 VITALS — BP 119/88 | HR 95 | Ht 64.0 in | Wt 321.8 lb

## 2019-06-04 DIAGNOSIS — Z09 Encounter for follow-up examination after completed treatment for conditions other than malignant neoplasm: Secondary | ICD-10-CM | POA: Diagnosis not present

## 2019-06-04 DIAGNOSIS — N926 Irregular menstruation, unspecified: Secondary | ICD-10-CM

## 2019-06-04 DIAGNOSIS — N939 Abnormal uterine and vaginal bleeding, unspecified: Secondary | ICD-10-CM

## 2019-06-04 MED ORDER — MEDROXYPROGESTERONE ACETATE 10 MG PO TABS: 10.0000 mg | ORAL_TABLET | Freq: Every day | ORAL | 2 refills | Status: DC

## 2019-06-04 NOTE — Patient Instructions (Signed)
Medroxyprogesterone tablets What is this medicine? MEDROXYPROGESTERONE (me DROX ee proe JES te rone) is a hormone in a class called progestins. It is commonly used to prevent the uterine lining from overgrowth in women taking an estrogen after menopause. It is also used to treat irregular menstrual bleeding or a lack of menstrual bleeding in women. This medicine may be used for other purposes; ask your health care provider or pharmacist if you have questions. COMMON BRAND NAME(S): Amen, Provera What should I tell my health care provider before I take this medicine? They need to know if you have any of these conditions:  blood vessel disease or a history of a blood clot in the lungs or legs  breast, cervical or vaginal cancer  heart disease  kidney disease  liver disease  migraine  recent miscarriage or abortion  mental depression  migraine  seizures (convulsions)  stroke  vaginal bleeding that has not been evaluated  an unusual or allergic reaction to medroxyprogesterone, other medicines, foods, dyes, or preservatives  pregnant or trying to get pregnant  breast-feeding How should I use this medicine? Take this medicine by mouth with a glass of water. Follow the directions on the prescription label. Take your doses at regular intervals. Do not take your medicine more often than directed. Talk to your pediatrician regarding the use of this medicine in children. Special care may be needed. While this drug may be prescribed for children as young as 13 years for selected conditions, precautions do apply. Overdosage: If you think you have taken too much of this medicine contact a poison control center or emergency room at once. NOTE: This medicine is only for you. Do not share this medicine with others. What if I miss a dose? If you miss a dose, take it as soon as you can. If it is almost time for your next dose, take only that dose. Do not take double or extra doses. What may  interact with this medicine?  barbiturate medicines for inducing sleep or treating seizures (convulsions)  bosentan  carbamazepine  phenytoin  rifampin  St. John's Wort This list may not describe all possible interactions. Give your health care provider a list of all the medicines, herbs, non-prescription drugs, or dietary supplements you use. Also tell them if you smoke, drink alcohol, or use illegal drugs. Some items may interact with your medicine. What should I watch for while using this medicine? Visit your health care professional for regular checks on your progress. You will need a regular breast and pelvic exam. If you have any reason to think you are pregnant, stop taking this medicine at once and contact your doctor or health care professional. What side effects may I notice from receiving this medicine? Side effects that you should report to your doctor or health care professional as soon as possible:  breast tenderness or discharge  changes in mood or emotions, such as depression  changes in vision or speech  pain in the abdomen, chest, groin, or leg  severe headache  skin rash, itching, or hives  sudden shortness of breath  unusually weak or tired  yellowing of skin or eyes Side effects that usually do not require medical attention (report to your doctor or health care professional if they continue or are bothersome):  acne  change in menstrual bleeding pattern or flow  changes in sexual desire  facial hair growth  fluid retention and swelling  headache  upset stomach  weight gain or loss This list  may not describe all possible side effects. Call your doctor for medical advice about side effects. You may report side effects to FDA at 1-800-FDA-1088. Where should I keep my medicine? Keep out of the reach of children. Store at room temperature between 20 and 25 degrees C (68 and 77 degrees F). Throw away any unused medicine after the expiration  date. NOTE: This sheet is a summary. It may not cover all possible information. If you have questions about this medicine, talk to your doctor, pharmacist, or health care provider.  2020 Elsevier/Gold Standard (2008-07-01 11:26:12)  

## 2019-06-04 NOTE — Progress Notes (Signed)
Patient here to discuss ultrasound report.  

## 2020-07-02 ENCOUNTER — Emergency Department: Payer: Managed Care, Other (non HMO)

## 2020-07-02 ENCOUNTER — Other Ambulatory Visit: Payer: Self-pay

## 2020-07-02 ENCOUNTER — Encounter: Payer: Self-pay | Admitting: Emergency Medicine

## 2020-07-02 ENCOUNTER — Emergency Department
Admission: EM | Admit: 2020-07-02 | Discharge: 2020-07-02 | Disposition: A | Discharge status: Home / Self Care | Payer: Managed Care, Other (non HMO) | Attending: Emergency Medicine | Admitting: Emergency Medicine

## 2020-07-02 DIAGNOSIS — R079 Chest pain, unspecified: Secondary | ICD-10-CM | POA: Insufficient documentation

## 2020-07-02 LAB — BASIC METABOLIC PANEL
Anion gap: 7 (ref 5–15)
BUN: 17 mg/dL (ref 6–20)
CO2: 28 mmol/L (ref 22–32)
Calcium: 8.9 mg/dL (ref 8.9–10.3)
Chloride: 100 mmol/L (ref 98–111)
Creatinine, Ser: 0.76 mg/dL (ref 0.44–1.00)
GFR, Estimated: >60 mL/min (ref >60)
Glucose, Bld: 148 mg/dL — ABNORMAL HIGH (ref 70–99)
Potassium: 3.4 mmol/L — ABNORMAL LOW (ref 3.5–5.1)
Sodium: 135 mmol/L (ref 135–145)

## 2020-07-02 LAB — CBC
HCT: 37.9 % (ref 36.0–46.0)
Hemoglobin: 12.4 g/dL (ref 12.0–15.0)
MCH: 29.5 pg (ref 26.0–34.0)
MCHC: 32.7 g/dL (ref 30.0–36.0)
MCV: 90.2 fL (ref 80.0–100.0)
Platelets: 234 10*3/uL (ref 150–400)
RBC: 4.2 MIL/uL (ref 3.87–5.11)
RDW: 13.2 % (ref 11.5–15.5)
WBC: 9.9 10*3/uL (ref 4.0–10.5)
nRBC: 0 % (ref 0.0–0.2)

## 2020-07-02 LAB — TROPONIN I (HIGH SENSITIVITY)
Troponin I (High Sensitivity): 2 ng/L (ref <18)
Troponin I (High Sensitivity): <2 ng/L (ref <18)

## 2020-07-02 MED ORDER — FAMOTIDINE 20 MG PO TABS: 20.0000 mg | ORAL_TABLET | Freq: Every day | ORAL | 1 refills | Status: DC

## 2020-07-02 NOTE — ED Notes (Signed)
E signature pad not working. Pt educated on discharge instructions and verbalized understanding.  

## 2020-07-02 NOTE — ED Provider Notes (Signed)
[facility] Emergency Department Provider Note   ____________________________________________   I have reviewed the triage vital signs and the nursing notes.   HISTORY  Chief Complaint Chest Pain   History limited by: Not Limited   HPI Nancy Sloan is a 34 y.o. female who presents to the emergency department today because of concern for chest pain. She states that it has been happening on and off for the past 3 weeks to one month. She states when it comes it can vary in duration but then it could be gone for up to a couple of days. The pain is located primarily in her left chest. She does have some pain radiating to her axilla and under her shoulder blades. The patient denies any associated shortness of breath or vomiting. Did think it might be related to gas at one time and took a medication which gave some relief. The patient states that she has not noticed anything that brings the pain on. She denies any recent travel or prolonged immobilization. Denies any leg swelling. Denies history of blood clots. Not on any birth control.  Records reviewed. Per medical record review patient has a history of anemia.   Past Medical History:  Diagnosis Date  . Anemia   . Herpes genitalis in women     Patient Active Problem List   Diagnosis Date Noted  . BMI 50.0-59.9, adult (HCC) 03/12/2017    Past Surgical History:  Procedure Laterality Date  . NO PAST SURGERIES      Prior to Admission medications   Medication Sig Start Date End Date Taking? Authorizing Provider  medroxyPROGESTERone (PROVERA) 10 MG tablet Take 1 tablet (10 mg total) by mouth daily. Use for ten days 06/04/19   Gunnar Bulla, CNM    Allergies Patient has no known allergies.  Family History  Problem Relation Age of Onset  . Thyroid disease Mother   . Cancer - Cervical Mother   . Heart disease Mother   . Endometriosis Sister   . Breast cancer Neg Hx   . Ovarian cancer Neg  Hx   . Colon cancer Neg Hx   . Diabetes Neg Hx     Social History Social History   Tobacco Use  . Smoking status: Never Smoker  . Smokeless tobacco: Never Used  Vaping Use  . Vaping Use: Never used  Substance Use Topics  . Alcohol use: Yes    Comment: rare  . Drug use: No    Review of Systems Constitutional: No fever/chills Eyes: No visual changes. ENT: No sore throat. Cardiovascular: Positive for chest pain. Respiratory: Denies shortness of breath. Gastrointestinal: No abdominal pain.  No nausea, no vomiting.  No diarrhea.   Genitourinary: Negative for dysuria. Musculoskeletal: Positive for back pain. Skin: Negative for rash. Neurological: Negative for headaches, focal weakness or numbness.  ____________________________________________   PHYSICAL EXAM:  VITAL SIGNS: ED Triage Vitals [07/02/20 1619]  Enc Vitals Group     BP 129/82     Pulse Rate 99     Resp 18     Temp 98.4 F (36.9 C)     Temp Source Oral     SpO2 100 %     Weight (!) 320 lb (145.2 kg)     Height 5\' 4"  (1.626 m)     Head Circumference      Peak Flow      Pain Score 3    Constitutional: Alert and oriented.  Eyes: Conjunctivae are normal.  ENT  Head: Normocephalic and atraumatic.      Nose: No congestion/rhinnorhea.      Mouth/Throat: Mucous membranes are moist.      Neck: No stridor. Hematological/Lymphatic/Immunilogical: No cervical lymphadenopathy. Cardiovascular: Normal rate, regular rhythm.  No murmurs, rubs, or gallops.  Respiratory: Normal respiratory effort without tachypnea nor retractions. Breath sounds are clear and equal bilaterally. No wheezes/rales/rhonchi. Gastrointestinal: Soft and non tender. No rebound. No guarding.  Genitourinary: Deferred Musculoskeletal: Normal range of motion in all extremities. No lower extremity edema. Neurologic:  Normal speech and language. No gross focal neurologic deficits are appreciated.  Skin:  Skin is warm, dry and intact. No rash  noted. Psychiatric: Mood and affect are normal. Speech and behavior are normal. Patient exhibits appropriate insight and judgment.  ____________________________________________    LABS (pertinent positives/negatives)  CBC wbc 9.9, hgb 12.4, plt 234 BMP wnl except k 3.4, glu 148 Trop hs 2 to  <2  ____________________________________________   EKG  I, Phineas Semen, attending physician, personally viewed and interpreted this EKG  EKG Time: 1612 Rate: 92 Rhythm: normal sinus rhythm Axis: normal Intervals: qtc 405 QRS: narrow ST changes: no st elevation Impression: normal ekg  ____________________________________________    RADIOLOGY  CXR No acute cardiopulmonary disease  ____________________________________________   PROCEDURES  Procedures  ____________________________________________   INITIAL IMPRESSION / ASSESSMENT AND PLAN / ED COURSE  Pertinent labs & imaging results that were available during my care of the patient were reviewed by me and considered in my medical decision making (see chart for details).   Patient presented to the emergency department today because of concern for chest pain. The patient states that it does have some radiation. Work up here in the emergency department reassuring with negative troponin, ecg and xray. At this time do not think patient suffering from PE or dissection. At the time of my exam patient is now pain free. Do wonder if GI cause is the etiology of the pain. Discussed this with the patient. Will plan on discharging with antacid and dietary guidelines. Discussed return precautions.   ____________________________________________   FINAL CLINICAL IMPRESSION(S) / ED DIAGNOSES  Final diagnoses:  Nonspecific chest pain     Note: This dictation was prepared with Dragon dictation. Any transcriptional errors that result from this process are unintentional     Phineas Semen, MD 07/02/20 2122

## 2020-07-02 NOTE — Discharge Instructions (Addendum)
Please seek medical attention for any high fevers, chest pain, shortness of breath, change in behavior, persistent vomiting, bloody stool or any other new or concerning symptoms.  

## 2020-07-02 NOTE — ED Triage Notes (Signed)
Pt states L chest pain and mid back pain on and off for a few weeks. Pt states pain worsened today.   Pt admits N/V off and on over the last few weeks.  Pt noted in NAD at this time, A&Ox4, ambulatory to triage room.

## 2020-07-27 ENCOUNTER — Telehealth: Payer: Self-pay

## 2020-07-27 NOTE — Telephone Encounter (Signed)
mychart message sent to patient re: no visitors 

## 2020-07-28 ENCOUNTER — Encounter: Payer: Self-pay | Admitting: Certified Nurse Midwife

## 2020-07-28 ENCOUNTER — Other Ambulatory Visit: Payer: Self-pay

## 2020-07-28 ENCOUNTER — Ambulatory Visit (INDEPENDENT_AMBULATORY_CARE_PROVIDER_SITE_OTHER): Payer: 59 | Admitting: Certified Nurse Midwife

## 2020-07-28 VITALS — BP 121/82 | HR 96 | Ht 64.0 in | Wt 326.4 lb

## 2020-07-28 DIAGNOSIS — N920 Excessive and frequent menstruation with regular cycle: Secondary | ICD-10-CM | POA: Diagnosis not present

## 2020-07-28 DIAGNOSIS — N644 Mastodynia: Secondary | ICD-10-CM

## 2020-07-28 DIAGNOSIS — N6452 Nipple discharge: Secondary | ICD-10-CM

## 2020-07-28 NOTE — Progress Notes (Signed)
GYN ENCOUNTER NOTE  Subjective:       Nancy Sloan is a 35 y.o. G60P2002 female is here for gynecologic evaluation of the following issues:  1. Bilateral breast pain and nipple discharge (with squeezing) for the last two (2) months. 2. Spotting-started 07/17/2020, reports regular visit since office visit on 06/04/2019  Denies difficulty breathing or respiratory distress, chest pain, abdominal pain, excessive vaginal bleeding, dysuria, and leg pain or swelling.    Gynecologic History  Patient's last menstrual period was 07/17/2020 (exact date).  Contraception: condoms  Last Pap: 2017-2018. Results were: normal per patient  Obstetric History  OB History  Gravida Para Term Preterm AB Living  2 2 2     2   SAB IAB Ectopic Multiple Live Births          2    # Outcome Date GA Lbr Len/2nd Weight Sex Delivery Anes PTL Lv  2 Term 09/17/17 [redacted]w[redacted]d  7 lb 2 oz (3.232 kg) F Vag-Spont EPI N LIV  1 Term 10/18/11   6 lb (2.722 kg) F Vag-Spont EPI N LIV    Past Medical History:  Diagnosis Date  . Anemia   . Herpes genitalis in women     Past Surgical History:  Procedure Laterality Date  . NO PAST SURGERIES      Current Outpatient Medications on File Prior to Visit  Medication Sig Dispense Refill  . famotidine (PEPCID) 20 MG tablet Take 1 tablet (20 mg total) by mouth daily. 30 tablet 1   No current facility-administered medications on file prior to visit.    No Known Allergies  Social History   Socioeconomic History  . Marital status: Married    Spouse name: Not on file  . Number of children: Not on file  . Years of education: Not on file  . Highest education level: Not on file  Occupational History  . Not on file  Tobacco Use  . Smoking status: Never Smoker  . Smokeless tobacco: Never Used  Vaping Use  . Vaping Use: Never used  Substance and Sexual Activity  . Alcohol use: Yes    Comment: rare  . Drug use: No  . Sexual activity: Yes    Partners: Male    Birth  control/protection: Condom  Other Topics Concern  . Not on file  Social History Narrative  . Not on file   Social Determinants of Health   Financial Resource Strain: Not on file  Food Insecurity: Not on file  Transportation Needs: Not on file  Physical Activity: Not on file  Stress: Not on file  Social Connections: Not on file  Intimate Partner Violence: Not on file    Family History  Problem Relation Age of Onset  . Thyroid disease Mother   . Cancer - Cervical Mother   . Heart disease Mother   . Endometriosis Sister   . Breast cancer Neg Hx   . Ovarian cancer Neg Hx   . Colon cancer Neg Hx   . Diabetes Neg Hx     The following portions of the patient's history were reviewed and updated as appropriate: allergies, current medications, past family history, past medical history, past social history, past surgical history and problem list.  Review of Systems  ROS negative except as noted above. Information obtained from patient.   Objective:   BP 121/82   Pulse 96   Ht 5\' 4"  (1.626 m)   Wt (!) 326 lb 7 oz (148.1 kg)   LMP 07/17/2020 (  Exact Date)   BMI 56.03 kg/m    CONSTITUTIONAL: Well-developed, well-nourished female in no acute distress.   BREASTS: Breasts appear normal, no suspicious masses, no skin or nipple changes or axillary nodes.  MUSCULOSKELETAL: Normal range of motion. No tenderness.  No cyanosis, clubbing, or edema.  Assessment:   1. Breast pain  - Prolactin - Progesterone - FSH/LH - Estradiol - Beta hCG quant (ref lab)  2. Nipple discharge  - Prolactin - Progesterone - FSH/LH - Estradiol - Beta hCG quant (ref lab)  3. Spotting  - Progesterone - FSH/LH - Estradiol - Beta hCG quant (ref lab)   Plan:   Labs today, see orders. Will contact patient with results and schedule ultrasound if needed.   Reviewed red flag symptoms and when to call.   RTC if symptoms worsen or fail to improve.    Serafina Royals, CNM [facility], [facility] 07/28/20 9:42 PM

## 2020-07-29 LAB — BETA HCG QUANT (REF LAB): hCG Quant: <1 m[IU]/mL

## 2020-07-29 LAB — PROLACTIN: Prolactin: 8.1 ng/mL (ref 4.8–23.3)

## 2020-07-29 LAB — FSH/LH
FSH: 8.8 m[IU]/mL
LH: 18.6 m[IU]/mL

## 2020-07-29 LAB — PROGESTERONE: Progesterone: 0.2 ng/mL

## 2020-07-29 LAB — ESTRADIOL: Estradiol: 50.1 pg/mL

## 2020-08-05 ENCOUNTER — Other Ambulatory Visit: Payer: Self-pay | Admitting: Certified Nurse Midwife

## 2020-08-05 DIAGNOSIS — N6452 Nipple discharge: Secondary | ICD-10-CM

## 2020-08-05 DIAGNOSIS — N920 Excessive and frequent menstruation with regular cycle: Secondary | ICD-10-CM

## 2020-08-05 MED ORDER — MEDROXYPROGESTERONE ACETATE 10 MG PO TABS: 10.0000 mg | ORAL_TABLET | Freq: Every day | ORAL | 2 refills | Status: DC

## 2020-08-05 NOTE — Progress Notes (Signed)
Rx Provera, see orders.   Breast ultrasounds ordered due to nipple discharge with normal labs, see chart.    Nancy Sloan, CNM [facility], [facility] 08/05/20 2:06 PM

## 2020-08-12 ENCOUNTER — Ambulatory Visit
Admission: RE | Admit: 2020-08-12 | Discharge: 2020-08-12 | Disposition: A | Discharge status: Home / Self Care | Payer: Managed Care, Other (non HMO) | Source: Ambulatory Visit | Attending: Certified Nurse Midwife | Admitting: Certified Nurse Midwife

## 2020-08-12 ENCOUNTER — Other Ambulatory Visit: Payer: Self-pay

## 2020-08-12 DIAGNOSIS — N6452 Nipple discharge: Secondary | ICD-10-CM

## 2021-01-20 ENCOUNTER — Ambulatory Visit (INDEPENDENT_AMBULATORY_CARE_PROVIDER_SITE_OTHER): Payer: 59 | Admitting: Certified Nurse Midwife

## 2021-01-20 ENCOUNTER — Other Ambulatory Visit (HOSPITAL_COMMUNITY)
Admission: RE | Admit: 2021-01-20 | Discharge: 2021-01-20 | Disposition: A | Discharge status: Home / Self Care | Payer: Managed Care, Other (non HMO) | Source: Ambulatory Visit | Attending: Certified Nurse Midwife | Admitting: Certified Nurse Midwife

## 2021-01-20 ENCOUNTER — Encounter: Payer: Self-pay | Admitting: Certified Nurse Midwife

## 2021-01-20 ENCOUNTER — Other Ambulatory Visit: Payer: Self-pay

## 2021-01-20 VITALS — BP 138/89 | HR 89 | Resp 16 | Ht 64.0 in | Wt 326.4 lb

## 2021-01-20 DIAGNOSIS — N898 Other specified noninflammatory disorders of vagina: Secondary | ICD-10-CM | POA: Diagnosis present

## 2021-01-20 DIAGNOSIS — N9089 Other specified noninflammatory disorders of vulva and perineum: Secondary | ICD-10-CM | POA: Insufficient documentation

## 2021-01-20 DIAGNOSIS — R102 Pelvic and perineal pain unspecified side: Secondary | ICD-10-CM

## 2021-01-20 DIAGNOSIS — R109 Unspecified abdominal pain: Secondary | ICD-10-CM

## 2021-01-20 DIAGNOSIS — N8110 Cystocele, unspecified: Secondary | ICD-10-CM

## 2021-01-20 DIAGNOSIS — M545 Low back pain, unspecified: Secondary | ICD-10-CM

## 2021-01-20 DIAGNOSIS — R32 Unspecified urinary incontinence: Secondary | ICD-10-CM

## 2021-01-20 NOTE — Patient Instructions (Addendum)
Pelvic Organ Prolapse Pelvic organ prolapse is a condition in women that involves the stretching, bulging, or dropping of pelvic organs into an abnormal position, past the opening of the vagina. It happens when the muscles and tissues that surround and support pelvic structures become weak or stretched. Pelvic organ prolapse can involve the: Vagina (vaginal prolapse). Uterus (uterine prolapse). Bladder (cystocele). Rectum (rectocele). Intestines (enterocele). When organs other than the vagina are involved, they often bulge into thevagina or protrude from the vagina, depending on how severe the prolapse is. What are the causes? This condition may be caused by: Pregnancy, labor, and childbirth. Past pelvic surgery. Lower levels of the hormone estrogen due to menopause. Consistently lifting more than 50 lb (23 kg). Obesity. Long-term difficulty passing stool (chronic constipation). Long-term, or chronic, cough. Fluid buildup in the abdomen due to certain conditions. What are the signs or symptoms? Symptoms of this condition include: Leaking a little urine (loss of bladder control) when you cough, sneeze, strain, and exercise (stress incontinence). This may be worse immediately after childbirth. It may gradually improve over time. Feeling pressure in your pelvis or vagina. This pressure may increase when you cough or when you are passing stool. A bulge that protrudes from the opening of your vagina. Difficulty passing urine or stool. Pain in your lower back. Pain or discomfort during sex, or decreased interest in sex. Repeated bladder infections (urinary tract infections). Difficulty inserting a tampon. In some people, this condition causes no symptoms. How is this diagnosed? This condition may be diagnosed based on a vaginal and rectal exam. During the exam, you may be asked to cough and strain while you are lying down, sitting, and standing up. Your health care provider will determine if  other tests arerequired, such as bladder function tests. How is this treated? Treatment for this condition may depend on your symptoms. Treatment may include: Lifestyle changes, such as drinking plenty of fluids and eating foods that are high in fiber. Emptying your bladder at scheduled times (bladder training therapy). This can help reduce or avoid urinary incontinence. Estrogen. This may help mild prolapse by increasing the strength and tone of pelvic floor muscles. Kegel exercises. These may help mild cases of prolapse by strengthening and tightening the muscles of the pelvic floor. A soft, flexible device that helps support the vaginal walls and keep pelvic organs in place (pessary). This is inserted into your vagina by your health care provider. Surgery. This is often the only form of treatment for severe prolapse. Follow these instructions at home: Eating and drinking Avoid drinking beverages that contain caffeine or alcohol. Increase your intake of high-fiber foods to decrease constipation and straining during bowel movements. Activity Lose weight if recommended by your health care provider. Avoid heavy lifting and straining with exercise and work. Do not hold your breath when you perform mild to moderate lifting and exercise activities. Limit your activities as directed by your health care provider. Do Kegel exercises as directed by your health care provider. To do this: Squeeze your pelvic floor muscles tight. You should feel a tight lift in your rectal area and a tightness in your vaginal area. Keep your stomach, buttocks, and legs relaxed. Hold the muscles tight for up to 10 seconds. Then relax your muscles. Repeat this exercise 50 times a day, or as much as told by your health care provider. Continue to do this exercise for at least 4-6 weeks, or for as long as told by your health care provider. General instructions   Take over-the-counter and prescription medicines only as told by  your health care provider. Wear a sanitary pad or adult diapers if you have urinary incontinence. If you have a pessary, take care of it as told by your health care provider. Keep all follow-up visits. This is important. Contact a health care provider if you: Have symptoms that interfere with your daily activities or sex life. Need medicine to help with the discomfort. Notice bleeding from your vagina that is not related to your menstrual period. Have a fever. Have pain or bleeding when you urinate. Have bleeding when you pass stool. Pass urine when you have sex. Have chronic constipation. Have a pessary that falls out. Have a foul-smelling vaginal discharge. Have an unusual, low pain in your abdomen. Get help right away if you: Cannot pass urine. Summary Pelvic organ prolapse is the stretching, bulging, or dropping of pelvic organs into an abnormal position. It happens when the muscles and tissues that surround and support pelvic structures become weak or stretched. When organs other than the vagina are involved, they often bulge into the vagina or protrude from it, depending on how severe the prolapse is. In most cases, this condition needs to be treated only if it produces symptoms. Treatment may include lifestyle changes, estrogen, Kegel exercises, pessary insertion, or surgery. Avoid heavy lifting and straining with exercise and work. Do not hold your breath when you perform mild to moderate lifting and exercise activities. Limit your activities as directed by your health care provider. This information is not intended to replace advice given to you by your health care provider. Make sure you discuss any questions you have with your healthcare provider. Document Revised: 12/28/2019 Document Reviewed: 12/28/2019 Elsevier Patient Education  2022 [employer]  

## 2021-01-20 NOTE — Progress Notes (Signed)
GYN ENCOUNTER NOTE  Subjective:       Nancy Sloan is a 35 y.o. G60P2002 female is here for gynecologic evaluation of the following issues:  1. Abdominal pressure, lower back pain and cramping for one (1) to two (2) months that has increased over the last two (2) days 2. Incomplete bladder emptying   Denies difficulty breathing or respiratory distress, chest pain, and leg pain.   Seen by PCP with similar symptoms on 01/17/2021; for further details, please see note.    Gynecologic History  Patient's last menstrual period was 01/05/2021 (approximate).  Contraception: condoms  Last Pap: 2018. Results were: normal per patient  Obstetric History  OB History  Gravida Para Term Preterm AB Living  2 2 2     2   SAB IAB Ectopic Multiple Live Births          2    # Outcome Date GA Lbr Len/2nd Weight Sex Delivery Anes PTL Lv  2 Term 09/17/17 [redacted]w[redacted]d  7 lb 2 oz (3.232 kg) F Vag-Spont EPI N LIV  1 Term 10/18/11   6 lb (2.722 kg) F Vag-Spont EPI N LIV    Past Medical History:  Diagnosis Date   Anemia    Herpes genitalis in women     Past Surgical History:  Procedure Laterality Date   NO PAST SURGERIES      No current outpatient medications on file prior to visit.   No current facility-administered medications on file prior to visit.    No Known Allergies  Social History   Socioeconomic History   Marital status: Married    Spouse name: Not on file   Number of children: Not on file   Years of education: Not on file   Highest education level: Not on file  Occupational History   Not on file  Tobacco Use   Smoking status: Never   Smokeless tobacco: Never  Vaping Use   Vaping Use: Never used  Substance and Sexual Activity   Alcohol use: Yes    Comment: rare   Drug use: No   Sexual activity: Yes    Partners: Male    Birth control/protection: Condom  Other Topics Concern   Not on file  Social History Narrative   Not on file   Social Determinants of Health    Financial Resource Strain: Not on file  Food Insecurity: Not on file  Transportation Needs: Not on file  Physical Activity: Not on file  Stress: Not on file  Social Connections: Not on file  Intimate Partner Violence: Not on file    Family History  Problem Relation Age of Onset   Thyroid disease Mother    Cancer - Cervical Mother    Heart disease Mother    Endometriosis Sister    Breast cancer Neg Hx    Ovarian cancer Neg Hx    Colon cancer Neg Hx    Diabetes Neg Hx     The following portions of the patient's history were reviewed and updated as appropriate: allergies, current medications, past family history, past medical history, past social history, past surgical history and problem list.  Review of Systems  ROS negative except as noted above. Information obtained from patient.   Objective:   BP 138/89   Pulse 89   Resp 16   Ht 5\' 4"  (1.626 m)   Wt (!) 326 lb 6.4 oz (148.1 kg)   LMP 01/05/2021 (Approximate)   BMI 56.03 kg/m   CONSTITUTIONAL: Well-developed, well-nourished  female in no acute distress.   ABDOMEN: Soft, non distended; Non tender.    PELVIC:  External Genitalia: Normal  Vagina: Normal except for mild cystocele noted  Cervix: Normal  MUSCULOSKELETAL: Normal range of motion. No tenderness.  No cyanosis, clubbing, or edema.   Assessment:   1. Abdominal pressure  - POC Urinalysis Dipstick OB - US PELVIC COMPLETE WITH TRANSVAGINAL; Future - Ambulatory referral to Physical Therapy - Urine Culture - Cervicovaginal ancillary only  2. Urinary incontinence, unspecified type  - US PELVIC COMPLETE WITH TRANSVAGINAL; Future - Ambulatory referral to Physical Therapy - Urine Culture  3. Abdominal cramping  - US PELVIC COMPLETE WITH TRANSVAGINAL; Future - Ambulatory referral to Physical Therapy  4. Midline low back pain, unspecified chronicity, unspecified whether sciatica present  - US PELVIC COMPLETE WITH TRANSVAGINAL; Future -  Ambulatory referral to Physical Therapy - Urine Culture  5. Pelvic pain  - US PELVIC COMPLETE WITH TRANSVAGINAL; Future - Ambulatory referral to Physical Therapy - Cervicovaginal ancillary only  6. Female cystocele  - Urine Culture  7. Vulvar irritation  - Cervicovaginal ancillary only  8. Vaginal discharge  - Cervicovaginal ancillary only     Plan:   Vaginal swab collected, see orders.   Ultrasound and urine culture ordered, see chart.   Referral to Pelvic Floor Physical Therapy, see orders.   Reviewed red flag symptoms and when to call.   RTC as needed.     Serafina Royals, CNM [facility], [facility] 01/20/21 10:02 AM

## 2021-01-22 LAB — CERVICOVAGINAL ANCILLARY ONLY
Bacterial Vaginitis (gardnerella): NEGATIVE
Candida Glabrata: NEGATIVE
Candida Vaginitis: NEGATIVE
Comment: NEGATIVE
Comment: NEGATIVE
Comment: NEGATIVE

## 2021-01-22 LAB — URINE CULTURE

## 2021-01-30 ENCOUNTER — Other Ambulatory Visit: Payer: Self-pay

## 2021-01-30 ENCOUNTER — Ambulatory Visit: Payer: Managed Care, Other (non HMO) | Attending: Certified Nurse Midwife | Admitting: Physical Therapy

## 2021-01-30 DIAGNOSIS — M6281 Muscle weakness (generalized): Secondary | ICD-10-CM | POA: Diagnosis not present

## 2021-01-30 DIAGNOSIS — R278 Other lack of coordination: Secondary | ICD-10-CM | POA: Insufficient documentation

## 2021-01-30 NOTE — Therapy (Signed)
[facility] [address] [place], [place], [address] Phone: [phone]   Fax:  [phone]  Physical Therapy Evaluation  Patient Details  Name: Nancy Sloan MRN: [id number] Date of Birth: Jul 09, 1986 Referring Provider (PT): Jeralyn BennettLawhorn, [place]   Encounter Date: 01/30/2021   PT End of Session - 01/30/21 0947     Visit Number 1    Number of Visits 12    Date for PT Re-Evaluation 04/24/21    Authorization Type 01/30/2021    PT Start Time 0950    PT Stop Time 1030    PT Time Calculation (min) 40 min    Activity Tolerance Patient tolerated treatment well    Behavior [facility] for tasks assessed/performed             Past Medical History:  Diagnosis Date   Anemia    Herpes genitalis in women     Past Surgical History:  Procedure Laterality Date   NO PAST SURGERIES      There were no vitals filed for this visit.        [facility] - 01/30/21 0001       Assessment   Medical Diagnosis Abdominal pressure, UI, cystocele    Referring Provider (PT) Jeralyn BennettLawhorn, JM    Onset Date/Surgical Date 01/31/19    Hand Dominance Right    Next MD Visit 02/16/2021 US    Prior Therapy None for this dx      Balance Screen   Has the patient fallen in the past 6 months No            [facility] PHYSICAL THERAPY EVALUATION  SCREENING Red Flags: None Have you had any night sweats? Unexplained weight loss? Saddle anesthesia? Unexplained changes in bowel or bladder habits?  Precautions: None  SUBJECTIVE  Chief Complaint: Patient notes for about 2 years she has had some SUI with coughing/sneezing. However in the past few months she has noticed an increase in urgency, urinary incontinence without clear cause, and with transfers. Patient also reports that she has had to lean forward in order to completely empty the bladder; she adds that this feeling is subsiding mildly over the past few weeks.   Patient does  note presence of back pain with radicular symptoms: R sided anterior thigh. She is in the process of having this evaluated by MD.  Patient is mother to 34 year old and 22101 year old and maintains an active life including: hiing, dancing, yard work, reading.  Pertinent History:  Falls Negative.  Scoliosis Negative. Pulmonary disease/dysfunction Negative. Surgical history: Negative.   Obstetrical History: G2P2 Deliveries: SVD Tearing/Episiotomy: P1 episiotomy, P2 grade 2 Birthing position:  Gynecological History: Hysterectomy: No Vaginal/Abdominal Endometriosis: Negative Pelvic Organ Prolapse: Positive for "mild cystocele" Last Menstrual Period: 01/09/2021 (estimated); noted to be irregular (average 1 per 60 days) Pain with exam: No Heaviness/pressure: Yes    Urinary History: Incontinence: Positive. Onset: 2 years ago with a recent increase Triggers: nothing, coughing/sneezing, squatting with urge, lifting with urge. Amount: Min/Mod. Protective undergarments: No  Fluid Intake: 2-3 16-20 oz glasses H20, occasional sweet tea caffeinated, occasional Diet Dr Reino KentPepper (16 oz) Nocturia: 0-1x/night (6-8 hrs of sleep) Frequency of urination: every 1-2 hours Toileting posture: feet tucked Pain with urination: Negative Difficulty initiating urination: Negative Intermittent stream: Positive Frequent UTI: Negative.   Gastrointestinal History: [place] Stool Chart: Type 4 (Type 1-2) Frequency of BMs: ranges from 1x/day to 2x/week Pain with defecation: Negative Straining with defecation:  Positive; with sensation of incomplete emptying. (Negative for splinting) Hemorrhoids: Negative ; internal/external; active/latent Toileting posture: feet elevated Incontinence: Negative.  Sexual activity/pain: Pain with intercourse: Negative.   Initial penetration: No  Deep thrusting: No External stimulation: No Able to achieve orgasm: No  Location of pain: low back pain Current pain:  0/10  Max pain:   6/10 (prolonged positioning, prolonged standing) Least pain:  0/10 Pain quality: pain quality: aching and discomfort and stiffness Radiating pain: Yes ; RLE stops at knee (tingling, achy, numb) Location of pain: suprapubic to vulvar Current pain:  0/10  Max pain:  3/10 Least pain:  0/10 Pain quality: pain quality: aching, cramping, and discomfort Radiating pain: No   Current activities:  Hiking, reading, dancing, yard work  Patient Goals:  "Have more bladder control" ; "more freedom where I don't have to think about urinary leakage as much"   OBJECTIVE  Mental Status Patient is oriented to person, place and time.  Recent memory is intact.  Remote memory is intact.  Attention span and concentration are intact.  Expressive speech is intact.  Patient's fund of knowledge is within normal limits for educational level.  POSTURE/OBSERVATIONS:  Lumbar lordosis: increased Thoracic kyphosis: WNL Iliac crest height: equal bilaterally on visual assessment Patient frequently adjusts sitting posture and varies from leaning forward to reclining back.  GAIT: Patient ambulates with moderately wide BOS; no grossly apparent deficits on evaluation from lobby to treatment room today.  RANGE OF MOTION: deferred 2/2 to time constraints   LEFT RIGHT  Lumbar forward flexion (65):      Lumbar extension (30):     Lumbar lateral flexion (25):     Thoracic and Lumbar rotation (30 degrees):       Hip Flexion (0-125):      Hip IR (0-45):     Hip ER (0-45):     Hip Abduction (0-40):     Hip extension (0-15):       STRENGTH: MMT deferred 2/2 to time constraints  RLE LLE  Hip Flexion    Hip Extension    Hip Abduction     Hip Adduction     Hip ER     Hip IR     Knee Extension    Knee Flexion    Dorsiflexion     Plantarflexion (seated)     ABDOMINAL: deferred 2/2 to time constraints Palpation: Diastasis: Scar mobility: Rib flare:  SPECIAL TESTS: deferred 2/2 to time  constraints   PHYSICAL PERFORMANCE MEASURES: STS: hands on thighs to rise; grossly WFL  EXTERNAL PELVIC EXAM: deferred 2/2 to time constraints Palpation: Breath coordination: Voluntary Contraction: present/absent Relaxation: full/delayed/non-relaxing Perineal movement with sustained IAP increase ("bear down"): descent/no change/elevation/excessive descent Perineal movement with rapid IAP increase ("cough"): elevation/no change/descent  INTERNAL VAGINAL EXAM: deferred 2/2 to time constraints Introitus Appears:  Skin integrity:  Scar mobility: Strength (PERF):  Symmetry: Palpation: Prolapse:   INTERNAL RECTAL EXAM: not indicated Strength (PERF): Symmetry: Palpation: Prolapse:   OUTCOME MEASURES: FOTO (Urinary 55)   ASSESSMENT Patient is a 35 year old presenting to clinic with chief complaints of suprapubic pressure and urinary leakage. Today's evaluation is suggestive of deficits in pain, posture, PFM strength, PFM coordination, and IAP management as evidenced by UI with coughing/laughing/sneezing, UI with transfers/squatting, UI with no apparent cause, increased lumbar lordosis, 3/10 suprapubic discomfort, 6/10 worst back pain. Patient's responses on FOTO Urinary outcome measures (55) indicate moderate functional limitations/disability/distress. Patient's progress may be limited due to demands of caregiving; however,  patient's active lifestyle and motivation are advantageous. Patient was able to achieve basic understanding of PFM functions during today's evaluation and responded positively to educational interventions. Patient will benefit from continued skilled therapeutic intervention to address deficits in pain, posture, PFM strength, PFM coordination, and IAP management in order to increase function and improve overall QOL.  EDUCATION Patient educated on prognosis, POC, and provided with HEP including: not initiated. Patient articulated understanding and returned  demonstration. Patient will benefit from further education in order to maximize compliance and understanding for long-term therapeutic gains.  TREATMENT Neuromuscular Re-education: Patient educated on primary functions of the pelvic floor including: posture/balance, sexual pleasure, storage and elimination of waste from the body, abdominal cavity closure, and breath coordination.    Objective measurements completed on examination: See above findings.          PT Long Term Goals - 01/30/21 1355       PT LONG TERM GOAL #1   Title Patient will demonstrate independence with HEP in order to maximize therapeutic gains and improve carryover from physical therapy sessions to ADLs in the home and community.    Baseline IE: not initiated    Time 12    Period Weeks    Status New    Target Date 04/24/21      PT LONG TERM GOAL #2   Title Patient will report confidence in ability to control bladder > 7/10 in order to demonstrate improved function and ability to participate more fully in activities at home and in the community.    Baseline IE: to be assessed at next visit    Time 12    Period Weeks    Status New    Target Date 04/24/21      PT LONG TERM GOAL #3   Title Patient will demonstrate circumferential and sequential contraction of >4/5 MMT, > 6 sec hold x10 and 5 consecutive quick flicks with </= 10 min rest between testing bouts, and relaxation of the PFM coordinated with breath for improved management of intra-abdominal pressure and normal bowel and bladder function without the presence of pain nor incontinence in order to improve participation at home and in the community.    Baseline IE: to be assessed at next visit    Time 12    Period Weeks    Status New    Target Date 04/24/21      PT LONG TERM GOAL #4   Title Patient will demonstrate improved function as evidenced by a score of 66 on FOTO measure for full participation in activities at home and in the community.     Baseline IE: 55    Time 12    Period Weeks    Status New    Target Date 04/24/21                    Plan - 01/30/21 0948     Clinical Impression Statement Patient is a 35 year old presenting to clinic with chief complaints of suprapubic pressure and urinary leakage. Today's evaluation is suggestive of deficits in pain, posture, PFM strength, PFM coordination, and IAP management as evidenced by UI with coughing/laughing/sneezing, UI with transfers/squatting, UI with no apparent cause, increased lumbar lordosis, 3/10 suprapubic discomfort, 6/10 worst back pain. Patient's responses on FOTO Urinary outcome measures (55) indicate moderate functional limitations/disability/distress. Patient's progress may be limited due to demands of caregiving; however, patient's active lifestyle and motivation are advantageous. Patient was able to achieve basic understanding of  PFM functions during today's evaluation and responded positively to educational interventions. Patient will benefit from continued skilled therapeutic intervention to address deficits in pain, posture, PFM strength, PFM coordination, and IAP management in order to increase function and improve overall QOL.    Personal Factors and Comorbidities Fitness;Past/Current Experience;Comorbidity 2;Behavior Pattern    Comorbidities anemia, cystocele, BMI 50-59.9    Examination-Activity Limitations Continence;Squat;Transfers;Carry    Examination-Participation Restrictions Yard Work;Community Activity;Driving;Cleaning;Laundry    Stability/Clinical Decision Making Evolving/Moderate complexity    Clinical Decision Making Moderate    Rehab Potential Fair    PT Frequency 1x / week    PT Duration 12 weeks    PT Treatment/Interventions ADLs/Self Care Home Management;Cryotherapy;Moist Heat;Electrical Stimulation;Therapeutic exercise;Neuromuscular re-education;Patient/family education;Manual techniques;Scar mobilization;Taping;Joint Manipulations;Spinal  Manipulations    PT Next Visit Plan physical assessment    PT Home Exercise Plan not initiated    Consulted and Agree with Plan of Care Patient             Patient will benefit from skilled therapeutic intervention in order to improve the following deficits and impairments:  Abnormal gait, Decreased endurance, Decreased coordination, Decreased activity tolerance, Decreased strength, Pain, Postural dysfunction, Improper body mechanics, Obesity  Visit Diagnosis: Muscle weakness (generalized)  Other lack of coordination     Problem List Patient Active Problem List   Diagnosis Date Noted   BMI 50.0-59.9, adult (HCC) 03/12/2017    Sheria Lang PT, DPT [phone]  01/30/2021, 1:57 PM   [facility] [address] [place], [place], [address] Phone: [phone]   Fax:  [phone]  Name: Nancy Sloan MRN: [id number] Date of Birth: 1986-05-30

## 2021-02-06 ENCOUNTER — Ambulatory Visit: Payer: Managed Care, Other (non HMO) | Admitting: Physical Therapy

## 2021-02-06 ENCOUNTER — Encounter: Payer: Self-pay | Admitting: Physical Therapy

## 2021-02-06 ENCOUNTER — Other Ambulatory Visit: Payer: Self-pay

## 2021-02-06 DIAGNOSIS — M6281 Muscle weakness (generalized): Secondary | ICD-10-CM | POA: Diagnosis not present

## 2021-02-06 DIAGNOSIS — R278 Other lack of coordination: Secondary | ICD-10-CM

## 2021-02-06 NOTE — Therapy (Signed)
[facility] [address] [place], [place], [address] Phone: [phone]   Fax:  [phone]  Physical Therapy Treatment  Patient Details  Name: Nancy Sloan MRN: [id number] Date of Birth: [birth year] Referring Provider (PT): Jeralyn Bennett, [place]   Encounter Date: 02/06/2021   PT End of Session - 02/06/21 1406     Visit Number 2    Number of Visits 12    Date for PT Re-Evaluation 04/24/21    Authorization Type 01/30/2021    PT Start Time 1410    PT Stop Time 1450    PT Time Calculation (min) 40 min    Activity Tolerance Patient tolerated treatment well    Behavior During Therapy [facility] for tasks assessed/performed             Past Medical History:  Diagnosis Date   Anemia    Herpes genitalis in women     Past Surgical History:  Procedure Laterality Date   NO PAST SURGERIES      There were no vitals filed for this visit.   Subjective Assessment - 02/06/21 1407     Subjective Patient denies any significant changes or concerns since evaluation.    Currently in Pain? Yes    Pain Score 2     Pain Location Back               TREATMENT  Pre-treatment assessment: RANGE OF MOTION:    LEFT RIGHT  Lumbar forward flexion (65):  WNL    Lumbar extension (30): WNL    Lumbar lateral flexion (25):  WNL WNL  Thoracic and Lumbar rotation (30 degrees):    WNL WNL  Hip Flexion (0-125):   WNL WNL  Hip IR (0-45):  WNL WNL  Hip ER (0-45):  WNL WNL  Hip Abduction (0-40):  WNL WNL  Hip extension (0-15):  WNL WNL   SENSATION: Grossly intact to light touch bilateral LEs as determined by testing dermatomes L2-S2 Proprioception and hot/cold testing deferred on this date  STRENGTH: MMT   RLE LLE  Hip Flexion 5 5  Hip Extension 5 5  Hip Abduction  5 5  Hip Adduction  5 5  Hip ER  5 5  Hip IR  5 5  Knee Extension 5 5  Knee Flexion 5 5  Dorsiflexion  5 5  Plantarflexion (seated) 5 5   ABDOMINAL:  Palpation: no  TTP Diastasis: 3 finger superior to umbilicus, 4 finger at umbilicus, 3 finger below umbilicus Rib flare: none noted  SPECIAL TESTS: SLR (SN 92, -LR 0.29): R: Negative L:  Negative FABER (SN 81): R: Negative L: Negative FADIR (SN 94): R: Negative L: Negative   EXTERNAL PELVIC EXAM: Patient educated on the purpose of the pelvic exam and articulated understanding; patient consented to the exam verbally. Breath coordination: present Cued Contraction: present, perineal lift Cued relaxation: delayed Cough: no perineal movement  Neuromuscular Re-education: Supine hooklying diaphragmatic breathing with VCs and TCs for downregulation of the nervous system and improved management of IAP Supine hooklying, TrA activation with exhalation. VCs and TCs to decrease compensatory patterns and minimize aggravation of the lumbar paraspinals. Countertop TrA activation with exhalation. VCs and TCs to decrease compensatory patterns and minimize aggravation of the lumbar paraspinals.   Patient educated throughout session on appropriate technique and form using multi-modal cueing, HEP, and activity modification. Patient articulated understanding and returned demonstration.  Patient Response to interventions: Comfortable to return in 1 week for progression or PFM  downtraining  ASSESSMENT Patient presents to clinic with excellent motivation to participate in therapy. Patient demonstrates deficits in pain, posture, PFM strength, PFM coordination, and IAP management. Patient able to achieve diaphragmatic breathing and TrA activation with moderate cueing during today's session and responded positively to active interventions. Patient will benefit from continued skilled therapeutic intervention to address remaining deficits in pain, posture, PFM strength, PFM coordination, and IAP management in order to increase function and improve overall QOL.    PT Long Term Goals - 01/30/21 1355       PT LONG TERM GOAL #1    Title Patient will demonstrate independence with HEP in order to maximize therapeutic gains and improve carryover from physical therapy sessions to ADLs in the home and community.    Baseline IE: not initiated    Time 12    Period Weeks    Status New    Target Date 04/24/21      PT LONG TERM GOAL #2   Title Patient will report confidence in ability to control bladder > 7/10 in order to demonstrate improved function and ability to participate more fully in activities at home and in the community.    Baseline IE: to be assessed at next visit    Time 12    Period Weeks    Status New    Target Date 04/24/21      PT LONG TERM GOAL #3   Title Patient will demonstrate circumferential and sequential contraction of >4/5 MMT, > 6 sec hold x10 and 5 consecutive quick flicks with </= 10 min rest between testing bouts, and relaxation of the PFM coordinated with breath for improved management of intra-abdominal pressure and normal bowel and bladder function without the presence of pain nor incontinence in order to improve participation at home and in the community.    Baseline IE: to be assessed at next visit    Time 12    Period Weeks    Status New    Target Date 04/24/21      PT LONG TERM GOAL #4   Title Patient will demonstrate improved function as evidenced by a score of 66 on FOTO measure for full participation in activities at home and in the community.    Baseline IE: 55    Time 12    Period Weeks    Status New    Target Date 04/24/21                   Plan - 02/06/21 1406     Clinical Impression Statement Patient presents to clinic with excellent motivation to participate in therapy. Patient demonstrates deficits in pain, posture, PFM strength, PFM coordination, and IAP management. Patient able to achieve diaphragmatic breathing and TrA activation with moderate cueing during today's session and responded positively to active interventions. Patient will benefit from continued  skilled therapeutic intervention to address remaining deficits in pain, posture, PFM strength, PFM coordination, and IAP management in order to increase function and improve overall QOL.    Personal Factors and Comorbidities Fitness;Past/Current Experience;Comorbidity 2;Behavior Pattern    Comorbidities anemia, cystocele, BMI 50-59.9    Examination-Activity Limitations Continence;Squat;Transfers;Carry    Examination-Participation Restrictions Yard Work;Community Activity;Driving;Cleaning;Laundry    Stability/Clinical Decision Making Evolving/Moderate complexity    Rehab Potential Fair    PT Frequency 1x / week    PT Duration 12 weeks    PT Treatment/Interventions ADLs/Self Care Home Management;Cryotherapy;Moist Heat;Electrical Stimulation;Therapeutic exercise;Neuromuscular re-education;Patient/family education;Manual techniques;Scar mobilization;Taping;Joint Manipulations;Spinal Manipulations  PT Next Visit Plan progress abdominal training &/ PFM downtraining    PT Home Exercise Plan Access Code: [email or web address]    Consulted and Agree with Plan of Care Patient             Patient will benefit from skilled therapeutic intervention in order to improve the following deficits and impairments:  Abnormal gait, Decreased endurance, Decreased coordination, Decreased activity tolerance, Decreased strength, Pain, Postural dysfunction, Improper body mechanics, Obesity  Visit Diagnosis: Muscle weakness (generalized)  Other lack of coordination     Problem List Patient Active Problem List   Diagnosis Date Noted   BMI 50.0-59.9, adult (HCC) 03/12/2017    Sheria Lang PT, DPT [phone]  02/06/2021, 3:02 PM  [facility] [address] [place], [place], [address] Phone: [phone]   Fax:  [phone]  Name: Nancy Sloan MRN: [id number] Date of Birth: 1985-11-02

## 2021-02-12 ENCOUNTER — Emergency Department: Payer: Managed Care, Other (non HMO)

## 2021-02-12 ENCOUNTER — Emergency Department
Admission: EM | Admit: 2021-02-12 | Discharge: 2021-02-12 | Disposition: A | Discharge status: Home / Self Care | Payer: Managed Care, Other (non HMO) | Attending: Emergency Medicine | Admitting: Emergency Medicine

## 2021-02-12 ENCOUNTER — Other Ambulatory Visit: Payer: Self-pay

## 2021-02-12 DIAGNOSIS — W01198A Fall on same level from slipping, tripping and stumbling with subsequent striking against other object, initial encounter: Secondary | ICD-10-CM | POA: Diagnosis not present

## 2021-02-12 DIAGNOSIS — R040 Epistaxis: Secondary | ICD-10-CM | POA: Diagnosis not present

## 2021-02-12 DIAGNOSIS — S0083XA Contusion of other part of head, initial encounter: Secondary | ICD-10-CM

## 2021-02-12 DIAGNOSIS — S01511A Laceration without foreign body of lip, initial encounter: Secondary | ICD-10-CM | POA: Diagnosis not present

## 2021-02-12 DIAGNOSIS — R0981 Nasal congestion: Secondary | ICD-10-CM | POA: Diagnosis not present

## 2021-02-12 DIAGNOSIS — S0990XA Unspecified injury of head, initial encounter: Secondary | ICD-10-CM | POA: Diagnosis present

## 2021-02-12 MED ORDER — PHENYLEPHRINE HCL 0.5 % NA SOLN: 1.0000 [drp] | Freq: Once | NASAL | Status: DC

## 2021-02-12 MED ORDER — HYDROCODONE-ACETAMINOPHEN 5-325 MG PO TABS: 1.0000 | ORAL_TABLET | Freq: Three times a day (TID) | ORAL | 0 refills | Status: DC

## 2021-02-12 MED ORDER — OXYMETAZOLINE HCL 0.05 % NA SOLN
1.0000 | Freq: Once | NASAL | Status: AC
  Administered 2021-02-12: 1 via NASAL
  Filled 2021-02-12: qty 30

## 2021-02-12 NOTE — Discharge Instructions (Addendum)
Your exam and CTs are negative for any acute fracture to the face or nose, or any serious injury to the brain.  You should manage your nose injury with the Afrin provided.  Follow-up with your primary provider or [facility] for any ongoing concerns.  Return to the ED if needed.

## 2021-02-12 NOTE — ED Notes (Signed)
First Nurse Note: Pt to ED via POV, stating that she fell and hit her nose on the [facility] unit. Pt is in NAD.

## 2021-02-12 NOTE — ED Triage Notes (Signed)
Pt reports she tripped and fell onto an air conditioner (a wall unit) with impact to her nose and upper lip. No LOC, no blood thinners. Small cut, minimal bleeding. Minor trouble breathing through her nose. A&Ox4, ambulatory.

## 2021-02-12 NOTE — ED Provider Notes (Signed)
[facility] Emergency Department Provider Note  ____________________________________________   Event Date/Time   First MD Initiated Contact with Patient 02/12/21 1918     (approximate)  I have reviewed the triage vital signs and the nursing notes.   HISTORY  Chief Complaint Fall  HPI Nancy Sloan is a 35 y.o. female presents to the ED for evaluation of injury sustained following an mechanical fall.  Patient presents with bleeding to the right nostril, after she tripped and fell onto an air conditioner blower inside the house.  Patient describes making impact with her nose and frenulum primarily.  He denies any loss of consciousness, dental injury, lip or tongue lacerations.  She reports some nasal congestion, and some active bleeding.    Past Medical History:  Diagnosis Date   Anemia    Herpes genitalis in women     Patient Active Problem List   Diagnosis Date Noted   BMI 50.0-59.9, adult (HCC) 03/12/2017    Past Surgical History:  Procedure Laterality Date   NO PAST SURGERIES      Prior to Admission medications   Medication Sig Start Date End Date Taking? Authorizing Provider  HYDROcodone-acetaminophen (NORCO) 5-325 MG tablet Take 1 tablet by mouth 3 (three) times daily. 02/12/21  Yes Berman Grainger, Charlesetta Ivory, PA-C    Allergies Patient has no known allergies.  Family History  Problem Relation Age of Onset   Thyroid disease Mother    Cancer - Cervical Mother    Heart disease Mother    Endometriosis Sister    Breast cancer Neg Hx    Ovarian cancer Neg Hx    Colon cancer Neg Hx    Diabetes Neg Hx     Social History Social History   Tobacco Use   Smoking status: Never   Smokeless tobacco: Never  Vaping Use   Vaping Use: Never used  Substance Use Topics   Alcohol use: Yes    Comment: rare   Drug use: No    Review of Systems  Constitutional: No fever/chills Eyes: No visual changes. ENT: No sore throat. Nosebleed as  above Cardiovascular: Denies chest pain. Respiratory: Denies shortness of breath. Gastrointestinal: No abdominal pain.  No nausea, no vomiting.  No diarrhea.  No constipation. Genitourinary: Negative for dysuria. Musculoskeletal: Negative for back pain. Skin: Negative for rash. Neurological: Negative for headaches, focal weakness or numbness. ____________________________________________   PHYSICAL EXAM:  VITAL SIGNS: ED Triage Vitals  Enc Vitals Group     BP 02/12/21 1902 (!) 150/85     Pulse Rate 02/12/21 1902 84     Resp 02/12/21 1902 18     Temp 02/12/21 1902 98.5 F (36.9 C)     Temp Source 02/12/21 1902 Oral     SpO2 02/12/21 1902 100 %     Weight 02/12/21 1902 (!) 325 lb (147.4 kg)     Height 02/12/21 1902 5\' 4"  (1.626 m)     Head Circumference --      Peak Flow --      Pain Score 02/12/21 1907 4     Pain Loc --      Pain Edu? --      Excl. in GC? --     Constitutional: Alert and oriented. Well appearing and in no acute distress. Eyes: Conjunctivae are normal. PERRL. EOMI. Head: Atraumatic. Nose: No congestion/rhinnorhea.  Right nostril with active bleeding.  No septal hematomas appreciated. No gross deformity appreciated. Small superficial laceration to top of the philtrum.  Mouth/Throat: Mucous membranes are moist.  Oropharynx non-erythematous. Neck: No stridor.  No cervical spine tenderness to palpation. Cardiovascular: Normal rate, regular rhythm. Grossly normal heart sounds.  Good peripheral circulation. Respiratory: Normal respiratory effort.  No retractions. Lungs CTAB. Gastrointestinal: Soft and nontender. No distention. No abdominal bruits. No CVA tenderness. Musculoskeletal: No lower extremity tenderness nor edema.  No joint effusions. Neurologic:  Normal speech and language. No gross focal neurologic deficits are appreciated. No gait instability. Skin:  Skin is warm, dry and intact. No rash noted. Psychiatric: Mood and affect are normal. Speech and  behavior are normal.  ____________________________________________   LABS (all labs ordered are listed, but only abnormal results are displayed)  Labs Reviewed - No data to display ____________________________________________  EKG  ____________________________________________  RADIOLOGY I, Lissa Hoard, personally viewed and evaluated these images (plain radiographs) as part of my medical decision making, as well as reviewing the written report by the radiologist.  ED MD interpretation:  agree with report  Official radiology report(s): CT Head Wo Contrast  Result Date: 02/12/2021 CLINICAL DATA:  Facial trauma. Fall with impact to nose and upper lip. EXAM: CT HEAD WITHOUT CONTRAST CT MAXILLOFACIAL WITHOUT CONTRAST TECHNIQUE: Multidetector CT imaging of the head and maxillofacial structures were performed using the standard protocol without intravenous contrast. Multiplanar CT image reconstructions of the maxillofacial structures were also generated. COMPARISON:  None. FINDINGS: CT HEAD FINDINGS Brain: Normal anatomic configuration. No abnormal intra or extra-axial mass lesion or fluid collection. No abnormal mass effect or midline shift. No evidence of acute intracranial hemorrhage or infarct. Ventricular size is normal. Cerebellum unremarkable. Vascular: Unremarkable Skull: Intact Other: Mastoid air cells and middle ear cavities are clear. CT MAXILLOFACIAL FINDINGS Osseous: There is mild motion artifact limiting evaluation of the mandible. However, no definite acute fracture of the facial bones is identified. The mandible is appropriately seated within the temporomandibular fossa bilaterally. Orbits: Negative. No traumatic or inflammatory finding. Sinuses: The paranasal sinuses are clear. Soft tissues: Packing material noted within the right nares. Soft tissues are unremarkable. IMPRESSION: No acute intracranial abnormality.  No calvarial fracture. No acute facial fracture.  Electronically Signed   By: Helyn Numbers MD   On: 02/12/2021 20:39   CT Maxillofacial Wo Contrast  Result Date: 02/12/2021 CLINICAL DATA:  Facial trauma. Fall with impact to nose and upper lip. EXAM: CT HEAD WITHOUT CONTRAST CT MAXILLOFACIAL WITHOUT CONTRAST TECHNIQUE: Multidetector CT imaging of the head and maxillofacial structures were performed using the standard protocol without intravenous contrast. Multiplanar CT image reconstructions of the maxillofacial structures were also generated. COMPARISON:  None. FINDINGS: CT HEAD FINDINGS Brain: Normal anatomic configuration. No abnormal intra or extra-axial mass lesion or fluid collection. No abnormal mass effect or midline shift. No evidence of acute intracranial hemorrhage or infarct. Ventricular size is normal. Cerebellum unremarkable. Vascular: Unremarkable Skull: Intact Other: Mastoid air cells and middle ear cavities are clear. CT MAXILLOFACIAL FINDINGS Osseous: There is mild motion artifact limiting evaluation of the mandible. However, no definite acute fracture of the facial bones is identified. The mandible is appropriately seated within the temporomandibular fossa bilaterally. Orbits: Negative. No traumatic or inflammatory finding. Sinuses: The paranasal sinuses are clear. Soft tissues: Packing material noted within the right nares. Soft tissues are unremarkable. IMPRESSION: No acute intracranial abnormality.  No calvarial fracture. No acute facial fracture. Electronically Signed   By: Helyn Numbers MD   On: 02/12/2021 20:39    ____________________________________________   PROCEDURES  Procedure(s) performed (including Critical Care):  Procedures  Oxymetazoline 0.005% spray - per nostril ____________________________________________   INITIAL IMPRESSION / ASSESSMENT AND PLAN / ED COURSE  As part of my medical decision making, I reviewed the following data within the electronic MEDICAL RECORD NUMBER Old chart reviewed, Radiograph reviewed  WNL, Notes from prior ED visits, and [place] Controlled Substance Database     DDX: nasal fracture, dental injury, closed head injury  Patient ED evaluation of injury sustained following mechanical fall just prior to arrival.  She presents with no active nosebleed, after she fell and hit her face on a AC vent.  Patient was evaluated for complaints, with a normal CT of the head and face.  Exam is also benign and nosebleed was controlled with topical oxymetazoline.  Patient is discharged in stable condition, with a small prescription for hydrocodone for any ongoing pain.  She is referred to [facility] for any ongoing concerns.  Return precautions nosebleed management instructions have been reviewed. ____________________________________________   FINAL CLINICAL IMPRESSION(S) / ED DIAGNOSES  Final diagnoses:  Contusion of face, initial encounter  Right-sided nosebleed     ED Discharge Orders          Ordered    HYDROcodone-acetaminophen (NORCO) 5-325 MG tablet  3 times daily        02/12/21 2111             Note:  This document was prepared using Dragon voice recognition software and may include unintentional dictation errors.    Lissa Hoard, PA-C 02/12/21 2114    Jene Every, MD 02/12/21 2144

## 2021-02-13 ENCOUNTER — Ambulatory Visit: Payer: Managed Care, Other (non HMO) | Admitting: Physical Therapy

## 2021-02-16 ENCOUNTER — Ambulatory Visit
Admission: RE | Admit: 2021-02-16 | Discharge: 2021-02-16 | Disposition: A | Discharge status: Home / Self Care | Payer: Managed Care, Other (non HMO) | Source: Ambulatory Visit | Attending: Certified Nurse Midwife | Admitting: Certified Nurse Midwife

## 2021-02-16 ENCOUNTER — Other Ambulatory Visit: Payer: Self-pay

## 2021-02-16 DIAGNOSIS — R102 Pelvic and perineal pain: Secondary | ICD-10-CM | POA: Diagnosis present

## 2021-02-16 DIAGNOSIS — M545 Low back pain, unspecified: Secondary | ICD-10-CM | POA: Diagnosis present

## 2021-02-16 DIAGNOSIS — R109 Unspecified abdominal pain: Secondary | ICD-10-CM | POA: Diagnosis not present

## 2021-02-16 DIAGNOSIS — R32 Unspecified urinary incontinence: Secondary | ICD-10-CM | POA: Diagnosis present

## 2021-02-20 ENCOUNTER — Ambulatory Visit: Payer: Managed Care, Other (non HMO) | Attending: Obstetrics and Gynecology | Admitting: Physical Therapy

## 2021-02-20 ENCOUNTER — Other Ambulatory Visit: Payer: Self-pay

## 2021-02-20 ENCOUNTER — Encounter: Payer: Self-pay | Admitting: Physical Therapy

## 2021-02-20 DIAGNOSIS — R278 Other lack of coordination: Secondary | ICD-10-CM | POA: Insufficient documentation

## 2021-02-20 DIAGNOSIS — M6281 Muscle weakness (generalized): Secondary | ICD-10-CM | POA: Insufficient documentation

## 2021-02-20 NOTE — Therapy (Signed)
[facility] [address] [place], [place], [address] Phone: [phone]   Fax:  [phone]  Physical Therapy Treatment  Patient Details  Name: Nancy Sloan MRN: [id number] Date of Birth: 1986/05/08 Referring Provider (PT): Jeralyn Bennett, [place]   Encounter Date: 02/20/2021   PT End of Session - 02/20/21 1019     Visit Number 3    Number of Visits 12    Date for PT Re-Evaluation 04/24/21    Authorization Type 01/30/2021    PT Start Time 0950    PT Stop Time 1030    PT Time Calculation (min) 40 min    Activity Tolerance Patient tolerated treatment well    Behavior During Therapy [facility] for tasks assessed/performed             Past Medical History:  Diagnosis Date   Anemia    Herpes genitalis in women     Past Surgical History:  Procedure Laterality Date   NO PAST SURGERIES      There were no vitals filed for this visit.   Subjective Assessment - 02/20/21 0954     Subjective Patient notes that she is recovering from recent fall. Patient had recent US which came back negative. Patient reports that she was able to store urine for [place] without leakage. Patient notes bowel movements have been incomplete the past few days.    Currently in Pain? No/denies    Pain Score 0-No pain            TREATMENT Manual Therapy:  Colon massage with patient education on GI anatomy and typical function. Patient provided with handout for home practice.   Neuromuscular Re-education: Supine hooklying diaphragmatic breathing with VCs and TCs for downregulation of the nervous system and improved management of IAP Supine hooklying, PFM lengthening with inhalation. VCs and TCs to decrease compensatory patterns and encourage optimal relaxation of the PFM. Standing supported adductor stretch with diaphragmatic breathing for improved lengthening of PFM. VCs and TCs to decrease compensatory patterns and encourage optimal relaxation of the  PFM.    Patient educated throughout session on appropriate technique and form using multi-modal cueing, HEP, and activity modification. Patient articulated understanding and returned demonstration.  Patient Response to interventions: No report of pain; comfortable to add adductor stretches to HEP.  ASSESSMENT Patient presents to clinic with excellent motivation to participate in therapy. Patient demonstrates deficits in pain, posture, PFM strength, PFM coordination, and IAP management. Patient with increased adductor fasciculations during supine hooklying PFM lengthening during today's session and responded positively to supported standing stretch as well as other active interventions. Patient will benefit from continued skilled therapeutic intervention to address remaining deficits in pain, posture, PFM strength, PFM coordination, and IAP management in order to increase function and improve overall QOL.   PT Long Term Goals - 01/30/21 1355       PT LONG TERM GOAL #1   Title Patient will demonstrate independence with HEP in order to maximize therapeutic gains and improve carryover from physical therapy sessions to ADLs in the home and community.    Baseline IE: not initiated    Time 12    Period Weeks    Status New    Target Date 04/24/21      PT LONG TERM GOAL #2   Title Patient will report confidence in ability to control bladder > 7/10 in order to demonstrate improved function and ability to participate more fully in activities at home and in the community.  Baseline IE: to be assessed at next visit    Time 12    Period Weeks    Status New    Target Date 04/24/21      PT LONG TERM GOAL #3   Title Patient will demonstrate circumferential and sequential contraction of >4/5 MMT, > 6 sec hold x10 and 5 consecutive quick flicks with </= 10 min rest between testing bouts, and relaxation of the PFM coordinated with breath for improved management of intra-abdominal pressure and normal  bowel and bladder function without the presence of pain nor incontinence in order to improve participation at home and in the community.    Baseline IE: to be assessed at next visit    Time 12    Period Weeks    Status New    Target Date 04/24/21      PT LONG TERM GOAL #4   Title Patient will demonstrate improved function as evidenced by a score of 66 on FOTO measure for full participation in activities at home and in the community.    Baseline IE: 55    Time 12    Period Weeks    Status New    Target Date 04/24/21                   Plan - 02/20/21 1019     Clinical Impression Statement Patient presents to clinic with excellent motivation to participate in therapy. Patient demonstrates deficits in pain, posture, PFM strength, PFM coordination, and IAP management. Patient with increased adductor fasciculations during supine hooklying PFM lengthening during today's session and responded positively to supported standing stretch as well as other active interventions. Patient will benefit from continued skilled therapeutic intervention to address remaining deficits in pain, posture, PFM strength, PFM coordination, and IAP management in order to increase function and improve overall QOL.    Personal Factors and Comorbidities Fitness;Past/Current Experience;Comorbidity 2;Behavior Pattern    Comorbidities anemia, cystocele, BMI 50-59.9    Examination-Activity Limitations Continence;Squat;Transfers;Carry    Examination-Participation Restrictions Yard Work;Community Activity;Driving;Cleaning;Laundry    Stability/Clinical Decision Making Evolving/Moderate complexity    Rehab Potential Fair    PT Frequency 1x / week    PT Duration 12 weeks    PT Treatment/Interventions ADLs/Self Care Home Management;Cryotherapy;Moist Heat;Electrical Stimulation;Therapeutic exercise;Neuromuscular re-education;Patient/family education;Manual techniques;Scar mobilization;Taping;Joint Manipulations;Spinal  Manipulations    PT Next Visit Plan progress abdominal training &/ PFM downtraining    PT Home Exercise Plan Access Code: [email or web address]    Consulted and Agree with Plan of Care Patient             Patient will benefit from skilled therapeutic intervention in order to improve the following deficits and impairments:  Abnormal gait, Decreased endurance, Decreased coordination, Decreased activity tolerance, Decreased strength, Pain, Postural dysfunction, Improper body mechanics, Obesity  Visit Diagnosis: Muscle weakness (generalized)  Other lack of coordination     Problem List Patient Active Problem List   Diagnosis Date Noted   BMI 50.0-59.9, adult (HCC) 03/12/2017    Sheria Lang PT, DPT [phone]  02/20/2021, 12:09 PM  [facility] [address] [place], [place], [address] Phone: [phone]   Fax:  [phone]  Name: Nancy Sloan MRN: [id number] Date of Birth: [birth year]

## 2021-02-27 ENCOUNTER — Other Ambulatory Visit: Payer: Self-pay

## 2021-02-27 ENCOUNTER — Encounter: Payer: Self-pay | Admitting: Physical Therapy

## 2021-02-27 ENCOUNTER — Ambulatory Visit: Payer: Managed Care, Other (non HMO) | Admitting: Physical Therapy

## 2021-02-27 DIAGNOSIS — M6281 Muscle weakness (generalized): Secondary | ICD-10-CM | POA: Diagnosis not present

## 2021-02-27 DIAGNOSIS — R278 Other lack of coordination: Secondary | ICD-10-CM

## 2021-02-27 NOTE — Therapy (Signed)
[facility] [address] [place], [place], [address] Phone: [phone]   Fax:  [phone]  Physical Therapy Treatment  Patient Details  Name: Nancy Sloan MRN: [id number] Date of Birth: Sep 28, 1985 Referring Provider (PT): Jeralyn Bennett, [place]   Encounter Date: 02/27/2021   PT End of Session - 02/27/21 1008     Visit Number 4    Number of Visits 12    Date for PT Re-Evaluation 04/24/21    Authorization Type 01/30/2021    PT Start Time 0946    PT Stop Time 1025    PT Time Calculation (min) 39 min    Activity Tolerance Patient tolerated treatment well    Behavior During Therapy [facility] for tasks assessed/performed             Past Medical History:  Diagnosis Date   Anemia    Herpes genitalis in women     Past Surgical History:  Procedure Laterality Date   NO PAST SURGERIES      There were no vitals filed for this visit.   Subjective Assessment - 02/27/21 0948     Subjective Patient reports no leakage during weekend activities. Patient also reports pain in the lower abdome but does not attribute it to any specific activity.    Pain Score 2     Pain Location Abdomen            TREATMENT  Neuromuscular Re-education: Supine hooklying diaphragmatic breathing with VCs and TCs for downregulation of the nervous system and improved management of IAP Supine pelvic tilts anterior-posterior for improved postural awareness [facility] Supine TrA, tactile feedback as necessary Supine TrA with DRAM compression/correction, VCs as needed Supine marches with TrA, tactile feedback as necessary   Patient required rest time in between above activities as occasional compensations were noted. Patient educated throughout session on the IAP system, appropriate technique and form using verbal and tactile cueing. Additional abdominal rehab interventions added to HEP. Patient articulated understanding and verbalized  body awareness throughout session.   Patient Response to interventions: Mild back tightness with supine TrA rehab  ASSESSMENT Patient presents to clinic with excellent motivation to participate in therapy. Patient demonstrates deficits in pain, posture, PFM strength, PFM coordination, and IAP management. Patient responded positively to abdominal rehab interventions with minimal cueing for proper technique. Patient demonstrated occasional accessory muscle breathing during some of the activities but able to self-correct and return to diaphragmatic breathing. Patient will benefit from continued skilled therapeutic intervention to address remaining deficits in pain, posture, PFM strength, PFM coordination, and IAP management in order to increase function and improve overall QOL.    PT Long Term Goals - 01/30/21 1355       PT LONG TERM GOAL #1   Title Patient will demonstrate independence with HEP in order to maximize therapeutic gains and improve carryover from physical therapy sessions to ADLs in the home and community.    Baseline IE: not initiated    Time 12    Period Weeks    Status New    Target Date 04/24/21      PT LONG TERM GOAL #2   Title Patient will report confidence in ability to control bladder > 7/10 in order to demonstrate improved function and ability to participate more fully in activities at home and in the community.    Baseline IE: to be assessed at next visit    Time 12    Period Weeks    Status  New    Target Date 04/24/21      PT LONG TERM GOAL #3   Title Patient will demonstrate circumferential and sequential contraction of >4/5 MMT, > 6 sec hold x10 and 5 consecutive quick flicks with </= 10 min rest between testing bouts, and relaxation of the PFM coordinated with breath for improved management of intra-abdominal pressure and normal bowel and bladder function without the presence of pain nor incontinence in order to improve participation at home and in the community.     Baseline IE: to be assessed at next visit    Time 12    Period Weeks    Status New    Target Date 04/24/21      PT LONG TERM GOAL #4   Title Patient will demonstrate improved function as evidenced by a score of 66 on FOTO measure for full participation in activities at home and in the community.    Baseline IE: 55    Time 12    Period Weeks    Status New    Target Date 04/24/21                   Plan - 02/27/21 1008     Clinical Impression Statement Patient presents to clinic with excellent motivation to participate in therapy. Patient demonstrates deficits in pain, posture, PFM strength, PFM coordination, and IAP management. Patient responded positively to abdominal rehab interventions with minimal cueing for proper technique. Patient demonstrated occasional accessory muscle breathing during some of the activities but able to self-correct and return to diaphragmatic breathing. Patient will benefit from continued skilled therapeutic intervention to address remaining deficits in pain, posture, PFM strength, PFM coordination, and IAP management in order to increase function and improve overall QOL.    Personal Factors and Comorbidities Fitness;Past/Current Experience;Comorbidity 2;Behavior Pattern    Comorbidities anemia, cystocele, BMI 50-59.9    Examination-Activity Limitations Continence;Squat;Transfers;Carry    Examination-Participation Restrictions Yard Work;Community Activity;Driving;Cleaning;Laundry    Stability/Clinical Decision Making Evolving/Moderate complexity    Rehab Potential Fair    PT Frequency 1x / week    PT Duration 12 weeks    PT Treatment/Interventions ADLs/Self Care Home Management;Cryotherapy;Moist Heat;Electrical Stimulation;Therapeutic exercise;Neuromuscular re-education;Patient/family education;Manual techniques;Scar mobilization;Taping;Joint Manipulations;Spinal Manipulations    PT Next Visit Plan progress abdominal training &/ PFM downtraining     PT Home Exercise Plan Access Code: [email or web address]    Consulted and Agree with Plan of Care Patient             Patient will benefit from skilled therapeutic intervention in order to improve the following deficits and impairments:  Abnormal gait, Decreased endurance, Decreased coordination, Decreased activity tolerance, Decreased strength, Pain, Postural dysfunction, Improper body mechanics, Obesity  Visit Diagnosis: Muscle weakness (generalized)  Other lack of coordination     Problem List Patient Active Problem List   Diagnosis Date Noted   BMI 50.0-59.9, adult (HCC) 03/12/2017    Sheria Lang PT, DPT [phone]  02/27/2021, 12:06 PM  [place] [facility] [address] [place], [place], [address] Phone: [phone]   Fax:  [phone]  Name: Chayil Gantt MRN: [id number] Date of Birth: 1985/11/14

## 2021-03-06 ENCOUNTER — Other Ambulatory Visit: Payer: Self-pay

## 2021-03-06 ENCOUNTER — Encounter: Payer: Self-pay | Admitting: Physical Therapy

## 2021-03-06 ENCOUNTER — Ambulatory Visit: Payer: Managed Care, Other (non HMO) | Admitting: Physical Therapy

## 2021-03-06 DIAGNOSIS — M6281 Muscle weakness (generalized): Secondary | ICD-10-CM

## 2021-03-06 DIAGNOSIS — R278 Other lack of coordination: Secondary | ICD-10-CM

## 2021-03-06 NOTE — Therapy (Signed)
[facility] [address] [place], [place], [address] Phone: [phone]   Fax:  [phone]  Physical Therapy Treatment  Patient Details  Name: Nancy Sloan MRN: [id number] Date of Birth: 1985-10-11 Referring Provider (PT): Jeralyn Bennett, [place]   Encounter Date: 03/06/2021   PT End of Session - 03/06/21 0947     Visit Number 5    Number of Visits 12    Date for PT Re-Evaluation 04/24/21    Authorization Type 01/30/2021    PT Start Time 0950    PT Stop Time 1030    PT Time Calculation (min) 40 min    Activity Tolerance Patient tolerated treatment well    Behavior During Therapy [facility] for tasks assessed/performed             Past Medical History:  Diagnosis Date   Anemia    Herpes genitalis in women     Past Surgical History:  Procedure Laterality Date   NO PAST SURGERIES       Subjective Assessment - 03/06/21 0950     Subjective Patient reports feeling more in control of the urgency this week. Patient states incidence of being in control when she went to bathroom with her younger daughter and was able to suppress the urge. Patient able to practice adductor stretches and abdominal strengthening with no new complaints.    Currently in Pain? No/denies               TREATMENT  Neuromuscular Re-education: Supine diaphragmatic breathing with VCs and TCs for downregulation of nervous system and management of IAP.  Supine TrA activation with coordinated breathe and added UE challenge on exhalation, VCs as needed to decrease compensations and educated on the importance of quality with small UE movements.  Standing TrA activation with coordinated breathe and TrA activation on exhale.   Standing Pilates activity with VCs and TCs to decrease gluteal and thoracic compensations  -Serve a Haematologist, RTB   Patient educated throughout session on appropriate technique and form using multi-modal cueing, HEP, and activity  modification. Patient articulated understanding and returned demonstration.  Patient Response to interventions: Patient agreeable to new HEP activity. Patient found some difficulty coordinating standing Pilates activity but reassured we will practice in future sessions.   ASSESSMENT Patient presents to clinic with excellent motivation to participate in today's session. Patient demonstrates deficits in pain, posture, PFM strength, PFM coordination, and IAP management. Patient performed TrA activation in supine with UE challenge and minimal cueing in order to reduce activation of rectus abdominis mm. Patient able to perform standing TrA activation with RTB but compensations observed of the gluteal muscles towards ends of activity. Patient able to achieve progression of deep core during today's session and responded positively to active interventions. Patient will benefit from continued skilled therapeutic intervention to address remaining deficits in pain, posture, PFM strength, PFM coordination, and IAP management in order to increase prior level of function and increase overall QOL.     PT Long Term Goals - 01/30/21 1355       PT LONG TERM GOAL #1   Title Patient will demonstrate independence with HEP in order to maximize therapeutic gains and improve carryover from physical therapy sessions to ADLs in the home and community.    Baseline IE: not initiated    Time 12    Period Weeks    Status New    Target Date 04/24/21      PT LONG TERM GOAL #2  Title Patient will report confidence in ability to control bladder > 7/10 in order to demonstrate improved function and ability to participate more fully in activities at home and in the community.    Baseline IE: to be assessed at next visit    Time 12    Period Weeks    Status New    Target Date 04/24/21      PT LONG TERM GOAL #3   Title Patient will demonstrate circumferential and sequential contraction of >4/5 MMT, > 6 sec hold x10 and 5  consecutive quick flicks with </= 10 min rest between testing bouts, and relaxation of the PFM coordinated with breath for improved management of intra-abdominal pressure and normal bowel and bladder function without the presence of pain nor incontinence in order to improve participation at home and in the community.    Baseline IE: to be assessed at next visit    Time 12    Period Weeks    Status New    Target Date 04/24/21      PT LONG TERM GOAL #4   Title Patient will demonstrate improved function as evidenced by a score of 66 on FOTO measure for full participation in activities at home and in the community.    Baseline IE: 55    Time 12    Period Weeks    Status New    Target Date 04/24/21                   Plan - 03/06/21 0947     Clinical Impression Statement Patient presents to clinic with excellent motivation to participate in today's session. Patient demonstrates deficits in pain, posture, PFM strength, PFM coordination, and IAP management. Patient performed TrA activation in supine with UE challenge and minimal cueing in order to reduce activation of rectus abdominis mm. Patient able to perform standing TrA activation with resistance band but compensations observed of the gluteal muscles at end of activity. Patient able to achieve progression of deep core during today's session and responded positively to active interventions. Patient will benefit from continued skilled therapeutic intervention to address remaining deficits in pain, posture, PFM strength, PFM coordination, and IAP management in order to increase prior level of function and increase overall QOL.    Personal Factors and Comorbidities Fitness;Past/Current Experience;Comorbidity 2;Behavior Pattern    Comorbidities anemia, cystocele, BMI 50-59.9    Examination-Activity Limitations Continence;Squat;Transfers;Carry    Examination-Participation Restrictions Yard Work;Community Activity;Driving;Cleaning;Laundry     Stability/Clinical Decision Making Evolving/Moderate complexity    Rehab Potential Fair    PT Frequency 1x / week    PT Duration 12 weeks    PT Treatment/Interventions ADLs/Self Care Home Management;Cryotherapy;Moist Heat;Electrical Stimulation;Therapeutic exercise;Neuromuscular re-education;Patient/family education;Manual techniques;Scar mobilization;Taping;Joint Manipulations;Spinal Manipulations    PT Next Visit Plan progress abdominal training (standing) &/ PFM downtraining (stretches)    PT Home Exercise Plan Access Code: [email or web address]    Consulted and Agree with Plan of Care Patient             Patient will benefit from skilled therapeutic intervention in order to improve the following deficits and impairments:  Abnormal gait, Decreased endurance, Decreased coordination, Decreased activity tolerance, Decreased strength, Pain, Postural dysfunction, Improper body mechanics, Obesity  Visit Diagnosis: Muscle weakness (generalized)  Other lack of coordination     Problem List Patient Active Problem List   Diagnosis Date Noted   BMI 50.0-59.9, adult (HCC) 03/12/2017    Makhia Vosler, SPT  This entire session was performed under direct  supervision and direction of a licensed Estate agent . I have personally read, edited and approve of the note as written.  Sheria Lang PT, DPT [phone]  03/06/2021, 3:04 PM  [facility] [address] [place], [place], [address] Phone: [phone]   Fax:  [phone]  Name: Nancy Sloan MRN: [id number] Date of Birth: 1986/03/20

## 2021-03-13 ENCOUNTER — Encounter: Payer: Self-pay | Admitting: Physical Therapy

## 2021-03-13 ENCOUNTER — Ambulatory Visit: Payer: Managed Care, Other (non HMO) | Admitting: Physical Therapy

## 2021-03-13 ENCOUNTER — Other Ambulatory Visit: Payer: Self-pay

## 2021-03-13 DIAGNOSIS — R278 Other lack of coordination: Secondary | ICD-10-CM

## 2021-03-13 DIAGNOSIS — M6281 Muscle weakness (generalized): Secondary | ICD-10-CM | POA: Diagnosis not present

## 2021-03-13 NOTE — Therapy (Signed)
[facility] [address] [place], [place], [address] Phone: [phone]   Fax:  [phone]  Physical Therapy Treatment  Patient Details  Name: Nancy Sloan MRN: [id number] Date of Birth: June 23, 1986 Referring Provider (PT): Jeralyn Bennett, [place]   Encounter Date: 03/13/2021   PT End of Session - 03/13/21 1009     Visit Number 6    Number of Visits 12    Date for PT Re-Evaluation 04/24/21    Authorization Type 01/30/2021    PT Start Time 0947    PT Stop Time 1025    PT Time Calculation (min) 38 min    Activity Tolerance Patient tolerated treatment well    Behavior During Therapy [facility] for tasks assessed/performed             Past Medical History:  Diagnosis Date   Anemia    Herpes genitalis in women     Past Surgical History:  Procedure Laterality Date   NO PAST SURGERIES      There were no vitals filed for this visit.   Subjective Assessment - 03/13/21 0948     Subjective Patient has been able to perform HEP with no problems. Patient does report one instance after toileting a little bit of dribble. Also reports having a tingling sensation at the pubic bone and felt like "nerve pain." Patient reveals that periods are irregular and it may be due to upcoming period as breasts are tender and she does feel occasional cramping.    Currently in Pain? No/denies             TREATMENT  Neuromuscular Re-education: Supine diaphragmatic breathing with VCs and TCs for downregulation of nervous system and management of IAP.  Quadruped cat/cow with TrA activation and coordinated breathe to improve postural stability, verbal and tactile cues as needed  Quadruped TrA activation, added UE challenge on exhalation to improve postural stability, VCs as needed Quadruped TrA activation, added LE challenge on exhalation to improve postural stability and VCs as needed to decrease rotation of the pelvis   Reassessed:  ABDOMINAL:   Palpation: no TTP  Diastasis: 2 finger superior to umbilicus, 3 finger at umbilicus, 3 finger below umbilicus  Patient educated throughout session on appropriate technique and form using multi-modal cueing, HEP, and activity modification. Patient verbalized understanding and returned demonstration.  Patient Response to interventions: Patient with mild discomfort with UE challenge quadruped but comfortable to add to HEP.  ASSESSMENT Patient presents to clinic with excellent motivation to participate in today's session. Patient demonstrates deficits in pain, posture, PFM strength, PFM coordination, and IAP management. Patient performed TrA activation in quadruped with coordinated breath and very minimal cueing. Patient able to maintain TrA activation with an extremity challenge and minimal cueing for proper technique. DRAM re-examined in supine and presents with improved closure superior to the umbilicus (2 finger from 3) and at the umbilicus (3 finger from 4). Patient able to achieve progression of postural stability during today's session and responded positively to active interventions. Patient will benefit from continued skilled physical therapy to address remaining deficits in pain, posture, PFM strength, PFM coordination, and IAP management in order to increase prior level of function and increase overall QOL.      PT Long Term Goals - 01/30/21 1355       PT LONG TERM GOAL #1   Title Patient will demonstrate independence with HEP in order to maximize therapeutic gains and improve carryover from physical therapy sessions to ADLs  in the home and community.    Baseline IE: not initiated    Time 12    Period Weeks    Status New    Target Date 04/24/21      PT LONG TERM GOAL #2   Title Patient will report confidence in ability to control bladder > 7/10 in order to demonstrate improved function and ability to participate more fully in activities at home and in the community.    Baseline IE:  to be assessed at next visit    Time 12    Period Weeks    Status New    Target Date 04/24/21      PT LONG TERM GOAL #3   Title Patient will demonstrate circumferential and sequential contraction of >4/5 MMT, > 6 sec hold x10 and 5 consecutive quick flicks with </= 10 min rest between testing bouts, and relaxation of the PFM coordinated with breath for improved management of intra-abdominal pressure and normal bowel and bladder function without the presence of pain nor incontinence in order to improve participation at home and in the community.    Baseline IE: to be assessed at next visit    Time 12    Period Weeks    Status New    Target Date 04/24/21      PT LONG TERM GOAL #4   Title Patient will demonstrate improved function as evidenced by a score of 66 on FOTO measure for full participation in activities at home and in the community.    Baseline IE: 55    Time 12    Period Weeks    Status New    Target Date 04/24/21                   Plan - 03/13/21 1009     Clinical Impression Statement Patient presents to clinic with excellent motivation to participate in today's session. Patient demonstrates deficits in pain, posture, PFM strength, PFM coordination, and IAP management. Patient performed TrA activation in quadruped with coordinated breath and very minimal cueing. Patient able to maintain TrA activation with an extremity challenge and minimal cueing for proper technique. DRAM re-examined in supine and presents with improved closure superior to the umbilicus (2 finger from 3) and at the umbilicus (3 finger from 4). Patient able to achieve progression of postural stability during today's session and responded positively to active interventions. Patient will benefit from continued skilled physical therapy to address remaining deficits in pain, posture, PFM strength, PFM coordination, and IAP management in order to increase prior level of function and increase overall QOL.     Personal Factors and Comorbidities Fitness;Past/Current Experience;Comorbidity 2;Behavior Pattern    Comorbidities anemia, cystocele, BMI 50-59.9    Examination-Activity Limitations Continence;Squat;Transfers;Carry    Examination-Participation Restrictions Yard Work;Community Activity;Driving;Cleaning;Laundry    Stability/Clinical Decision Making Evolving/Moderate complexity    Rehab Potential Fair    PT Frequency 1x / week    PT Duration 12 weeks    PT Treatment/Interventions ADLs/Self Care Home Management;Cryotherapy;Moist Heat;Electrical Stimulation;Therapeutic exercise;Neuromuscular re-education;Patient/family education;Manual techniques;Scar mobilization;Taping;Joint Manipulations;Spinal Manipulations    PT Next Visit Plan PFM external assessment, progress abdominal training (standing) &/ PFM downtraining (stretches)    PT Home Exercise Plan Access Code: [email or web address]    Consulted and Agree with Plan of Care Patient             Patient will benefit from skilled therapeutic intervention in order to improve the following deficits and impairments:  Abnormal gait, Decreased endurance, Decreased  coordination, Decreased activity tolerance, Decreased strength, Pain, Postural dysfunction, Improper body mechanics, Obesity  Visit Diagnosis: Muscle weakness (generalized)  Other lack of coordination     Problem List Patient Active Problem List   Diagnosis Date Noted   BMI 50.0-59.9, adult (HCC) 03/12/2017    Oni Dietzman, SPT This entire session was performed under direct supervision and direction of a licensed Estate agent . I have personally read, edited and approve of the note as written.   Sheria Lang PT, DPT [phone]  03/13/2021, 1:26 PM  [facility] [address] [place], [place], [address] Phone: [phone]   Fax:  [phone]  Name: Tyler Robidoux MRN: [id number] Date of Birth: 11-Mar-1986

## 2021-03-21 ENCOUNTER — Ambulatory Visit: Payer: Managed Care, Other (non HMO) | Admitting: Physical Therapy

## 2021-05-03 ENCOUNTER — Encounter: Payer: Self-pay | Admitting: Obstetrics and Gynecology

## 2021-05-03 ENCOUNTER — Ambulatory Visit (INDEPENDENT_AMBULATORY_CARE_PROVIDER_SITE_OTHER): Payer: Managed Care, Other (non HMO) | Admitting: Obstetrics and Gynecology

## 2021-05-03 VITALS — BP 119/87 | HR 96 | Resp 16 | Ht 64.0 in | Wt 338.1 lb

## 2021-05-03 DIAGNOSIS — N9489 Other specified conditions associated with female genital organs and menstrual cycle: Secondary | ICD-10-CM

## 2021-05-03 MED ORDER — CLOBETASOL PROPIONATE 0.05 % EX OINT: 1.0000 "application " | TOPICAL_OINTMENT | CUTANEOUS | 0 refills | Status: DC

## 2021-05-03 NOTE — Progress Notes (Signed)
HPI:      [name] is a 35 y.o. Nancy Sloan who LMP was Patient's last menstrual period was 03/16/2021 (approximate).  Subjective:   She presents today continuing to have intermittent vulvar burning.  She states it is on the outside and usually to the anterior aspect of her labia.  She has had a significant work-up for this including a pelvic ultrasound, wet prep, Nuswab, use of boric acid, change in habits including bathing etc. She denies new sexual partners. Patient has had previous yeast infections and states that it is not like that. She has gone to pelvic therapy for cystocele and says this has helped.  It has made no effect on the burning symptoms.    Hx: The following portions of the patient's history were reviewed and updated as appropriate:             She  has a past medical history of Anemia and Herpes genitalis in women. She does not have any pertinent problems on file. She  has a past surgical history that includes No past surgeries. Her family history includes Cancer - Cervical in her mother; Endometriosis in her sister; Heart disease in her mother; Thyroid disease in her mother. She  reports that she has never smoked. She has never used smokeless tobacco. She reports current alcohol use. She reports that she does not use drugs. She has a current medication list which includes the following prescription(s): [START ON 05/04/2021] clobetasol ointment. She has No Known Allergies.       Review of Systems:  Review of Systems  Constitutional: Denied constitutional symptoms, night sweats, recent illness, fatigue, fever, insomnia and weight loss.  Eyes: Denied eye symptoms, eye pain, photophobia, vision change and visual disturbance.  Ears/Nose/Throat/Neck: Denied ear, nose, throat or neck symptoms, hearing loss, nasal discharge, sinus congestion and sore throat.  Cardiovascular: Denied cardiovascular symptoms, arrhythmia, chest pain/pressure, edema, exercise intolerance,  orthopnea and palpitations.  Respiratory: Denied pulmonary symptoms, asthma, pleuritic pain, productive sputum, cough, dyspnea and wheezing.  Gastrointestinal: Denied, gastro-esophageal reflux, melena, nausea and vomiting.  Genitourinary: See HPI for additional information.  Musculoskeletal: Denied musculoskeletal symptoms, stiffness, swelling, muscle weakness and myalgia.  Dermatologic: Denied dermatology symptoms, rash and scar.  Neurologic: Denied neurology symptoms, dizziness, headache, neck pain and syncope.  Psychiatric: Denied psychiatric symptoms, anxiety and depression.  Endocrine: Denied endocrine symptoms including hot flashes and night sweats.   Meds:   No current outpatient medications on file prior to visit.   No current facility-administered medications on file prior to visit.      Objective:     Vitals:   05/03/21 1442  BP: 119/87  Pulse: 96  Resp: 16   Filed Weights   05/03/21 1442  Weight: (!) 338 lb 1.6 oz (153.4 kg)              Examination of the vulva reveals no abnormalities.  There is some erythema posteriorly but patient has no symptoms in this area.          Assessment:    Nancy Sloan Patient Active Problem List   Diagnosis Date Noted   BMI 50.0-59.9, adult (HCC) 03/12/2017     1. Vulvar burning     Patient has had a significant work-up and has tried multiple different strategies without success.  She still has intermittent vulvar burning.   Plan:            1.  We discussed topical things that could cause burning including laundry detergents  fabric softeners toilet tissue etc.  2.  We will try clobetasol twice weekly and see if this makes any difference. Orders No orders of the defined types were placed in this encounter.    Meds ordered this encounter  Medications   clobetasol ointment (TEMOVATE) 0.05 %    Sig: Apply 1 application topically 2 (two) times a week.    Dispense:  45 g    Refill:  0      F/U  Return for Pt to  contact us if symptoms worsen, Annual Physical. I spent 22 minutes involved in the care of this patient preparing to see the patient by obtaining and reviewing her medical history (including labs, imaging tests and prior procedures), documenting clinical information in the electronic health record (EHR), counseling and coordinating care plans, writing and sending prescriptions, ordering tests or procedures and in direct communicating with the patient and medical staff discussing pertinent items from her history and physical exam.  Nancy Sloan, M.D. 05/03/2021 3:54 PM

## 2021-06-21 ENCOUNTER — Encounter: Payer: Managed Care, Other (non HMO) | Admitting: Obstetrics and Gynecology

## 2021-07-31 ENCOUNTER — Ambulatory Visit (INDEPENDENT_AMBULATORY_CARE_PROVIDER_SITE_OTHER): Payer: Managed Care, Other (non HMO) | Admitting: Obstetrics and Gynecology

## 2021-07-31 ENCOUNTER — Other Ambulatory Visit: Payer: Self-pay

## 2021-07-31 ENCOUNTER — Encounter: Payer: Self-pay | Admitting: Obstetrics and Gynecology

## 2021-07-31 ENCOUNTER — Other Ambulatory Visit (HOSPITAL_COMMUNITY)
Admission: RE | Admit: 2021-07-31 | Discharge: 2021-07-31 | Disposition: A | Discharge status: Home / Self Care | Payer: Managed Care, Other (non HMO) | Source: Ambulatory Visit | Attending: Obstetrics and Gynecology | Admitting: Obstetrics and Gynecology

## 2021-07-31 VITALS — BP 144/66 | HR 90 | Resp 16 | Ht 64.0 in | Wt 335.2 lb

## 2021-07-31 DIAGNOSIS — Z01419 Encounter for gynecological examination (general) (routine) without abnormal findings: Secondary | ICD-10-CM | POA: Diagnosis not present

## 2021-07-31 DIAGNOSIS — Z124 Encounter for screening for malignant neoplasm of cervix: Secondary | ICD-10-CM | POA: Diagnosis not present

## 2021-07-31 NOTE — Progress Notes (Signed)
HPI:      [name] is a 36 y.o. [id number] who LMP was Patient's last menstrual period was 06/28/2021 (approximate).  Subjective:   She presents today for her annual examination.  She was seen approximately 3 months ago and had issues with vulvar burning.  Her exam was negative however use of twice weekly clobetasol has worked for her.  She reports a significant decrease in burning and vulvar irritation. She states that she occasionally has breast discharge and bilateral breast pain.  She underwent a work-up for this that included prolactin and mammogram which were both normal.  She has been recently using primrose oil and she says this is helping and she does not have any further discharge and rarely has the pain. She states that her cycles are occasionally irregular.  She is using condoms for birth control.  She did have a withdrawal bleed recently with progesterone. She states that she has noticed a decrease in sex drive and desire.    Hx: The following portions of the patient's history were reviewed and updated as appropriate:             She  has a past medical history of Anemia and Herpes genitalis in women. She does not have any pertinent problems on file. She  has a past surgical history that includes No past surgeries. Her family history includes Cancer - Cervical in her mother; Endometriosis in her sister; Heart disease in her mother; Thyroid disease in her mother. She  reports that she has never smoked. She has never used smokeless tobacco. She reports current alcohol use. She reports that she does not use drugs. She has a current medication list which includes the following prescription(s): evening primrose oil and fish oil-coenzyme q10. She has No Known Allergies.       Review of Systems:  Review of Systems  Constitutional: Denied constitutional symptoms, night sweats, recent illness, fatigue, fever, insomnia and weight loss.  Eyes: Denied eye symptoms, eye pain,  photophobia, vision change and visual disturbance.  Ears/Nose/Throat/Neck: Denied ear, nose, throat or neck symptoms, hearing loss, nasal discharge, sinus congestion and sore throat.  Cardiovascular: Denied cardiovascular symptoms, arrhythmia, chest pain/pressure, edema, exercise intolerance, orthopnea and palpitations.  Respiratory: Denied pulmonary symptoms, asthma, pleuritic pain, productive sputum, cough, dyspnea and wheezing.  Gastrointestinal: Denied, gastro-esophageal reflux, melena, nausea and vomiting.  Genitourinary: Denied genitourinary symptoms including symptomatic vaginal discharge, pelvic relaxation issues, and urinary complaints.  Musculoskeletal: Denied musculoskeletal symptoms, stiffness, swelling, muscle weakness and myalgia.  Dermatologic: Denied dermatology symptoms, rash and scar.  Neurologic: Denied neurology symptoms, dizziness, headache, neck pain and syncope.  Psychiatric: Denied psychiatric symptoms, anxiety and depression.  Endocrine: Denied endocrine symptoms including hot flashes and night sweats.   Meds:   Current Outpatient Medications on File Prior to Visit  Medication Sig Dispense Refill   [employer] EVENING PRIMROSE OIL PO Take by mouth.     FISH OIL-COENZYME Q10 PO Take 1 tablet by mouth daily.     No current facility-administered medications on file prior to visit.       Objective:     Vitals:   07/31/21 0814  BP: (!) 144/66  Pulse: 90  Resp: 16    Filed Weights   07/31/21 0814  Weight: (!) 335 lb 3.2 oz (152 kg)              Physical examination General NAD, Conversant  HEENT Atraumatic; Op clear with mmm.  Normo-cephalic. Pupils reactive. Anicteric sclerae  Thyroid/Neck  Smooth without nodularity or enlargement. Normal ROM.  Neck Supple.  Skin No rashes, lesions or ulceration. Normal palpated skin turgor. No nodularity.  Breasts: No masses or discharge.  Symmetric.  No axillary adenopathy.  Lungs: Clear to auscultation.No rales or wheezes.  Normal Respiratory effort, no retractions.  Heart: NSR.  No murmurs or rubs appreciated. No periferal edema  Abdomen: Soft.  Non-tender.  No masses.  No HSM. No hernia  Extremities: Moves all appropriately.  Normal ROM for age. No lymphadenopathy.  Neuro: Oriented to PPT.  Normal mood. Normal affect.     Pelvic:   Vulva: Normal appearance.  No lesions.  Vagina: No lesions or abnormalities noted.  Support: Normal pelvic support.  Urethra No masses tenderness or scarring.  Meatus Normal size without lesions or prolapse.  Cervix: Normal appearance.  No lesions.  Anus: Normal exam.  No lesions.  Perineum: Normal exam.  No lesions.        Bimanual   Uterus: Normal size.  Non-tender.  Mobile.  AV.  Adnexae: No masses.  Non-tender to palpation.  Cul-de-sac: Negative for abnormality.   Exam limited by patient body habitus  Assessment:    G2P2002 Patient Active Problem List   Diagnosis Date Noted   BMI 50.0-59.9, adult ([place]) 03/12/2017     1. Encounter for well woman exam with routine gynecological exam   2. Cervical cancer screening        Plan:            1.  Basic Screening Recommendations The basic screening recommendations for asymptomatic women were discussed with the patient during her visit.  The age-appropriate recommendations were discussed with her and the rational for the tests reviewed.  When I am informed by the patient that another primary care physician has previously obtained the age-appropriate tests and they are up-to-date, only outstanding tests are ordered and referrals given as necessary.  Abnormal results of tests will be discussed with her when all of her results are completed.  Routine preventative health maintenance measures emphasized: Exercise/Diet/Weight control, Tobacco Warnings, Alcohol/Substance use risks and Stress Management Pap performed 2.  We have briefly touched on several of the above problems noted in the subjective portion.  If patient would  like further follow-up she will schedule a problem visit to discuss some of these issues. 3.  Continue clobetasol twice weekly as directed Orders No orders of the defined types were placed in this encounter.   No orders of the defined types were placed in this encounter.         F/U  Return in about 1 year (around 07/31/2022) for Annual Physical.  Finis Bud, M.D. 07/31/2021 8:50 AM

## 2021-08-02 LAB — CYTOLOGY - PAP
Comment: NEGATIVE
Diagnosis: NEGATIVE
Diagnosis: REACTIVE
High risk HPV: NEGATIVE

## 2021-08-03 ENCOUNTER — Encounter: Payer: Self-pay | Admitting: Obstetrics and Gynecology

## 2022-06-03 ENCOUNTER — Emergency Department: Payer: Managed Care, Other (non HMO)

## 2022-06-03 ENCOUNTER — Other Ambulatory Visit: Payer: Self-pay

## 2022-06-03 ENCOUNTER — Emergency Department
Admission: EM | Admit: 2022-06-03 | Discharge: 2022-06-03 | Disposition: A | Discharge status: Home / Self Care | Payer: Managed Care, Other (non HMO) | Attending: Emergency Medicine | Admitting: Emergency Medicine

## 2022-06-03 ENCOUNTER — Encounter: Payer: Self-pay | Admitting: Radiology

## 2022-06-03 DIAGNOSIS — R000 Tachycardia, unspecified: Secondary | ICD-10-CM | POA: Insufficient documentation

## 2022-06-03 DIAGNOSIS — O039 Complete or unspecified spontaneous abortion without complication: Secondary | ICD-10-CM | POA: Diagnosis not present

## 2022-06-03 LAB — CBC WITH DIFFERENTIAL/PLATELET
Abs Immature Granulocytes: 0.02 10*3/uL (ref 0.00–0.07)
Basophils Absolute: 0 10*3/uL (ref 0.0–0.1)
Basophils Relative: 1 %
Eosinophils Absolute: 0.1 10*3/uL (ref 0.0–0.5)
Eosinophils Relative: 2 %
HCT: 36.4 % (ref 36.0–46.0)
Hemoglobin: 12.3 g/dL (ref 12.0–15.0)
Immature Granulocytes: 0 %
Lymphocytes Relative: 35 %
Lymphs Abs: 2.7 10*3/uL (ref 0.7–4.0)
MCH: 29.6 pg (ref 26.0–34.0)
MCHC: 33.8 g/dL (ref 30.0–36.0)
MCV: 87.7 fL (ref 80.0–100.0)
Monocytes Absolute: 0.4 10*3/uL (ref 0.1–1.0)
Monocytes Relative: 5 %
Neutro Abs: 4.5 10*3/uL (ref 1.7–7.7)
Neutrophils Relative %: 57 %
Platelets: 216 10*3/uL (ref 150–400)
RBC: 4.15 MIL/uL (ref 3.87–5.11)
RDW: 13.2 % (ref 11.5–15.5)
WBC: 7.7 10*3/uL (ref 4.0–10.5)
nRBC: 0 % (ref 0.0–0.2)

## 2022-06-03 LAB — BASIC METABOLIC PANEL
Anion gap: 10 (ref 5–15)
BUN: 10 mg/dL (ref 6–20)
CO2: 20 mmol/L — ABNORMAL LOW (ref 22–32)
Calcium: 8.9 mg/dL (ref 8.9–10.3)
Chloride: 107 mmol/L (ref 98–111)
Creatinine, Ser: 0.76 mg/dL (ref 0.44–1.00)
GFR, Estimated: >60 mL/min (ref >60)
Glucose, Bld: 139 mg/dL — ABNORMAL HIGH (ref 70–99)
Potassium: 3.7 mmol/L (ref 3.5–5.1)
Sodium: 137 mmol/L (ref 135–145)

## 2022-06-03 LAB — ABO/RH: ABO/RH(D): O POS

## 2022-06-03 LAB — HCG, QUANTITATIVE, PREGNANCY: hCG, Beta Chain, Quant, S: 1643 m[IU]/mL — ABNORMAL HIGH (ref <5)

## 2022-06-03 MED ORDER — SODIUM CHLORIDE 0.9 % IV BOLUS
1000.0000 mL | Freq: Once | INTRAVENOUS | Status: AC
  Administered 2022-06-03: 1000 mL via INTRAVENOUS

## 2022-06-03 NOTE — ED Provider Notes (Signed)
Vitals:   06/03/22 1629 06/03/22 1847  BP: (!) 113/93 123/70  Pulse: (!) 135 (!) 104  Resp:  20  Temp: 98.4 F (36.9 C)   SpO2: 97% 99%     Patient resting comfortably, no distress.  Reports her bleeding has improved she is experiencing period like or very mild vaginal bleeding.  It is notably improved from when she arrived.  No pain or discomfort.  She is resting comfortably.  I suspect based on the clinical history heard low hCG and presentation that she is likely had a completed miscarriage.  However I did discuss with her the need for close follow-up, no need for further evaluation with OB/GYN.  Case care and clinical history and ultrasound discussed with Donato Schultz as well.  She is midwife for the [facility] service, and the patient has been set up with an appointment Tuesday for follow-up with her primary OB team.  Return precautions and treatment recommendations and follow-up discussed with the patient who is agreeable with the plan.    Sharyn Creamer, MD 06/03/22 2004

## 2022-06-03 NOTE — ED Notes (Signed)
E-signature pad unavailable - Pt verbalized understanding of D/C information - no additional concerns at this time.  

## 2022-06-03 NOTE — ED Provider Notes (Signed)
[facility] Provider Note    Event Date/Time   First MD Initiated Contact with Patient 06/03/22 1747     (approximate)   History   Miscarriage   HPI  Nancy Sloan is a 36 y.o. female who reports G3, P2 with an estimated delivery date in May.  Advises she is roughly [redacted] weeks pregnant.  Has a history of amenorrhea, 2 previous deliveries.  Reviewed note from Kathrin Ruddy, [place] October 10 the patient at that time had an ultrasound with a single viable intrauterine pregnancy at 8 weeks and 3 days.   Patient in her normal health, about day ago she started noticing a little bit of some slight crampiness in her lower abdomen did not think much of it thought perhaps it might be something like "round ligament" type pain which she has had previous pregnancies.  Then today she was out in the yard she bent down to pick something up and felt as though she had almost urinated on herself, but instead found that she was passing fluid and use the bathroom.  In the toilet she identified what was a "fetus" and clots.  She has had some vaginal bleeding, she has been through a few pads and has started to pass some clots, but still having some ongoing bleeding here.  Right now reports a very mild 5 out of 10 lower abdominal discomfort.  Felt slightly nauseated when it first occurred but that is gone away.  Currently does not wish for any medications.  No fevers or chills.  No recent illness.  Patient is very confident she likely had a miscarriage.  Physical Exam   Triage Vital Signs: ED Triage Vitals  Enc Vitals Group     BP 06/03/22 1629 (!) 113/93     Pulse Rate 06/03/22 1629 (!) 135     Resp --      Temp 06/03/22 1629 98.4 F (36.9 C)     Temp Source 06/03/22 1629 Oral     SpO2 06/03/22 1629 97 %     Weight 06/03/22 1630 (!) 317 lb (143.8 kg)     Height 06/03/22 1630 5\' 4"  (1.626 m)     Head Circumference --      Peak Flow --      Pain Score --      Pain Loc --       Pain Edu? --      Excl. in [place]? --     Most recent vital signs: Vitals:   06/03/22 1629 06/03/22 1847  BP: (!) 113/93 123/70  Pulse: (!) 135 (!) 104  Resp:  20  Temp: 98.4 F (36.9 C)   SpO2: 97% 99%     General: Awake, no distress.  Seated in chair pleasantly.  Husband at her bedside.  She is in no distress, conversant and pleasant. CV:  Good peripheral perfusion.  Resp:  Normal effort.  Abd:  No distention.  Soft nontender nondistended at this time. Other:  Warm well-perfused extremities.   ED Results / Procedures / Treatments   Labs (all labs ordered are listed, but only abnormal results are displayed) Labs Reviewed  BASIC METABOLIC PANEL - Abnormal; Notable for the following components:      Result Value   CO2 20 (*)    Glucose, Bld 139 (*)    All other components within normal limits  HCG, QUANTITATIVE, PREGNANCY - Abnormal; Notable for the following components:   hCG, Beta Chain, Quant, S 1,643 (*)  All other components within normal limits  CBC WITH DIFFERENTIAL/PLATELET  POC URINE PREG, ED  ABO/RH  ABO/RH     EKG     RADIOLOGY  US OB LESS THAN 14 WEEKS WITH OB TRANSVAGINAL  Result Date: 06/03/2022 CLINICAL DATA:  Bleeding EXAM: OBSTETRIC <14 WK [place] AND TRANSVAGINAL OB US TECHNIQUE: Both transabdominal and transvaginal ultrasound examinations were performed for complete evaluation of the gestation as well as the maternal uterus, adnexal regions, and pelvic cul-de-sac. Transvaginal technique was performed to assess early pregnancy. COMPARISON:  None Available. FINDINGS: Intrauterine gestational sac: None Yolk sac:  Not Visualized. Embryo:  Not Visualized. Cardiac Activity: Not Visualized. Heart Rate:   bpm MSD:   mm    w     d CRL:    mm    w    d                  [place] EDC: Subchorionic hemorrhage:  None Maternal uterus/adnexae: No adnexal mass or free fluid IMPRESSION: No intrauterine pregnancy visualized. Differential considerations would include early  intrauterine pregnancy too early to visualize, spontaneous abortion, or occult ectopic pregnancy. Recommend close clinical followup and serial quantitative beta HCGs and ultrasounds. Electronically Signed   By: Charlett Nose M.D.   On: 06/03/2022 19:02    Ultrasound results reviewed.  No intrauterine pregnancy visualized.  Discussed with Donato Schultz, midwife   PROCEDURES:  Critical Care performed: No  Procedures   MEDICATIONS ORDERED IN ED: Medications  sodium chloride 0.9 % bolus 1,000 mL (1,000 mLs Intravenous New Bag/Given 06/03/22 1747)     IMPRESSION / MDM / ASSESSMENT AND PLAN / ED COURSE  I reviewed the triage vital signs and the nursing notes.                              Differential diagnosis includes, but is not limited to, threatened miscarriage, vaginal bleeding during pregnancy, placenta previa, retained products of conception, completed miscarriage etc.  Patient seems to clearly have symptoms related to her pregnancy, and at this point given the description it seems that it is likely she had a miscarriage but we will certainly follow-up with further evaluation including ultrasound.  Her chemistry panel is normal with exception of very slightly reduced CO2 of 20.  Her CBC is normal without acute anemia.  Review of records indicates the patient has a O+ blood type in care everywhere.  Patient vital signs are notable for tachycardia.  She is fully awake and alert in no acute distress.  Aside from the tachycardia she did not seem to have evidence of acute concern.  We will continue to monitor closely, she does also report that she felt very anxious when this happened, and question if that may be the cause of her tachycardia.  Her hemoglobin is normal.  Rh + per [facility] labs HCG lower than expected for 14 weeks  Patient's presentation is most consistent with acute complicated illness / injury requiring diagnostic workup.     FINAL CLINICAL IMPRESSION(S) / ED DIAGNOSES    Final diagnoses:  Miscarriage     Rx / DC Orders   ED Discharge Orders     None        Note:  This document was prepared using Dragon voice recognition software and may include unintentional dictation errors.   Sharyn Creamer, MD 06/03/22 2012

## 2022-06-03 NOTE — Discharge Instructions (Addendum)
Please follow up closely with obstetrics and gynecology Tuesday this week (as planned).  We believe that you have had a miscarriage, but this does require close and careful follow-up in the next 2 days with your obstetrical team.  Return to the emergency room if your bleeding worsens, you become weak and dizzy or lightheaded, you have an episode of passing out, develop severe bleeding such as more than 1 soaked pad per hour for more than 3 straight hours, develop abdominal or pelvic pain, fevers chills or other new concerns arise.

## 2022-06-03 NOTE — ED Triage Notes (Signed)
Pt states she was [redacted] weeks pregnant and had cramping last night. Was outside and felt a pop, went inside to the bathroom and had light pink fluid in the clothes then sat on the commode and delivered the fetus into the commode. Pt states it was a formed fetus. Pt now passing large clots of blood.

## 2022-06-03 NOTE — ED Provider Triage Note (Signed)
Emergency Medicine Provider Triage Evaluation Note  Nancy Sloan , a 36 y.o. female  was evaluated in triage.  Pt complains of vaginal bleeding.  Patient states that she felt cramping went to the bathroom had vaginal bleeding and felt something drop out and she felt fetus in the toilet.  States she felt them for 1 hour.  Was approximately [redacted] weeks pregnant.  Review of Systems  Positive:  Negative:   Physical Exam  BP (!) 113/93 (BP Location: Left Arm)   Pulse (!) 135   Temp 98.4 F (36.9 C) (Oral)   Ht 5\' 4"  (1.626 m)   Wt (!) 143.8 kg   SpO2 97%   BMI 54.41 kg/m  Gen:   Awake, no distress   Resp:  Normal effort  MSK:   Moves extremities without difficulty  Other:    Medical Decision Making  Medically screening exam initiated at 5:43 PM.  Appropriate orders placed.  Diva Lemberger was informed that the remainder of the evaluation will be completed by another provider, this initial triage assessment does not replace that evaluation, and the importance of remaining in the ED until their evaluation is complete.  Patient is tachycardic, will give fluids, labs ordered, ultrasound ordered, patient is followed at [facility]    [facility], PA-C 06/03/22 1744

## 2022-08-01 ENCOUNTER — Encounter: Payer: Managed Care, Other (non HMO) | Admitting: Obstetrics and Gynecology

## 2022-08-01 DIAGNOSIS — Z01419 Encounter for gynecological examination (general) (routine) without abnormal findings: Secondary | ICD-10-CM

## 2022-10-10 ENCOUNTER — Emergency Department: Payer: Managed Care, Other (non HMO)

## 2022-10-10 ENCOUNTER — Other Ambulatory Visit: Payer: Self-pay

## 2022-10-10 ENCOUNTER — Emergency Department
Admission: EM | Admit: 2022-10-10 | Discharge: 2022-10-10 | Disposition: A | Discharge status: Home / Self Care | Payer: Managed Care, Other (non HMO) | Attending: Emergency Medicine | Admitting: Emergency Medicine

## 2022-10-10 ENCOUNTER — Encounter: Payer: Self-pay | Admitting: *Deleted

## 2022-10-10 DIAGNOSIS — Z6740 Type O blood, Rh positive: Secondary | ICD-10-CM | POA: Insufficient documentation

## 2022-10-10 DIAGNOSIS — O209 Hemorrhage in early pregnancy, unspecified: Secondary | ICD-10-CM | POA: Insufficient documentation

## 2022-10-10 DIAGNOSIS — Z3A01 Less than 8 weeks gestation of pregnancy: Secondary | ICD-10-CM | POA: Diagnosis not present

## 2022-10-10 DIAGNOSIS — O4690 Antepartum hemorrhage, unspecified, unspecified trimester: Secondary | ICD-10-CM

## 2022-10-10 LAB — COMPREHENSIVE METABOLIC PANEL
ALT: 16 U/L (ref 0–44)
AST: 18 U/L (ref 15–41)
Albumin: 3.5 g/dL (ref 3.5–5.0)
Alkaline Phosphatase: 73 U/L (ref 38–126)
Anion gap: 9 (ref 5–15)
BUN: 12 mg/dL (ref 6–20)
CO2: 24 mmol/L (ref 22–32)
Calcium: 8.8 mg/dL — ABNORMAL LOW (ref 8.9–10.3)
Chloride: 102 mmol/L (ref 98–111)
Creatinine, Ser: 0.78 mg/dL (ref 0.44–1.00)
GFR, Estimated: >60 mL/min (ref >60)
Glucose, Bld: 138 mg/dL — ABNORMAL HIGH (ref 70–99)
Potassium: 3.7 mmol/L (ref 3.5–5.1)
Sodium: 135 mmol/L (ref 135–145)
Total Bilirubin: 0.5 mg/dL (ref 0.3–1.2)
Total Protein: 7.6 g/dL (ref 6.5–8.1)

## 2022-10-10 LAB — CBC
HCT: 39.9 % (ref 36.0–46.0)
Hemoglobin: 12.9 g/dL (ref 12.0–15.0)
MCH: 29.1 pg (ref 26.0–34.0)
MCHC: 32.3 g/dL (ref 30.0–36.0)
MCV: 90.1 fL (ref 80.0–100.0)
Platelets: 247 10*3/uL (ref 150–400)
RBC: 4.43 MIL/uL (ref 3.87–5.11)
RDW: 13.5 % (ref 11.5–15.5)
WBC: 8.4 10*3/uL (ref 4.0–10.5)
nRBC: 0 % (ref 0.0–0.2)

## 2022-10-10 LAB — HCG, QUANTITATIVE, PREGNANCY: hCG, Beta Chain, Quant, S: 3687 m[IU]/mL — ABNORMAL HIGH (ref <5)

## 2022-10-10 LAB — URINALYSIS, ROUTINE W REFLEX MICROSCOPIC
Bilirubin Urine: NEGATIVE
Glucose, UA: NEGATIVE mg/dL
Ketones, ur: NEGATIVE mg/dL
Nitrite: NEGATIVE
Protein, ur: 100 mg/dL — AB
RBC / HPF: >50 RBC/hpf (ref 0–5)
Specific Gravity, Urine: 1.024 (ref 1.005–1.030)
pH: 6 (ref 5.0–8.0)

## 2022-10-10 NOTE — ED Provider Notes (Signed)
[facility] Provider Note    Event Date/Time   First MD Initiated Contact with Patient 10/10/22 1926     (approximate)   History   Vaginal Bleeding   HPI  Nancy Sloan is a 37 y.o. female who is G4, P2 presents emergency department stating that she has had vaginal bleeding.  Patient had a miscarriage in November.  She is afraid she is having another miscarriage.  States she is approximately [redacted] weeks pregnant.  Noticed some brown spotting and now has seen some red clots.      Physical Exam   Triage Vital Signs: ED Triage Vitals  Enc Vitals Group     BP 10/10/22 1915 118/83     Pulse Rate 10/10/22 1912 100     Resp 10/10/22 1912 18     Temp 10/10/22 1912 98.3 F (36.8 C)     Temp Source 10/10/22 1912 Oral     SpO2 10/10/22 1912 99 %     Weight 10/10/22 1913 (!) 320 lb (145.2 kg)     Height 10/10/22 1913 5\' 4"  (1.626 m)     Head Circumference --      Peak Flow --      Pain Score 10/10/22 1913 0     Pain Loc --      Pain Edu? --      Excl. in [place]? --     Most recent vital signs: Vitals:   10/10/22 1912 10/10/22 1915  BP:  118/83  Pulse: 100   Resp: 18   Temp: 98.3 F (36.8 C)   SpO2: 99%      General: Awake, no distress.   CV:  Good peripheral perfusion. regular rate and  rhythm Resp:  Normal effort. Abd:  No distention.   Other:      ED Results / Procedures / Treatments   Labs (all labs ordered are listed, but only abnormal results are displayed) Labs Reviewed  COMPREHENSIVE METABOLIC PANEL - Abnormal; Notable for the following components:      Result Value   Glucose, Bld 138 (*)    Calcium 8.8 (*)    All other components within normal limits  URINALYSIS, ROUTINE W REFLEX MICROSCOPIC - Abnormal; Notable for the following components:   Color, Urine YELLOW (*)    APPearance CLOUDY (*)    Hgb urine dipstick LARGE (*)    Protein, ur 100 (*)    Leukocytes,Ua MODERATE (*)    Bacteria, UA RARE (*)    All other  components within normal limits  CBC  HCG, QUANTITATIVE, PREGNANCY  POC URINE PREG, ED     EKG     RADIOLOGY Ultrasound OB less than 14 weeks    PROCEDURES:   Procedures   MEDICATIONS ORDERED IN ED: Medications - No data to display   IMPRESSION / MDM / [place] / ED COURSE  I reviewed the triage vital signs and the nursing notes.                              Differential diagnosis includes, but is not limited to, subchorionic hemorrhage, ectopic pregnancy, threatened miscarriage, blighted ovum, miscarriage  Patient's presentation is most consistent with acute presentation with potential threat to life or bodily function.   Labs and imaging ordered   In review the patient's past medical charts, her ABO/Rh is O+.  Will not repeat this lab at this time.  Patient  had a positive pregnancy test on 09/26/2022 at her doctor's office.  Urinalysis has moderate leuks, however the patient does not have any dysuria so will not treat her for UTI at this time.  Will let her follow-up with her regular doctor concerning this.  Return emergency department if worsening.  Ultrasound was independently reviewed and interpreted by me as having a gestational sac.  Do not see a heartbeat.  However this may just be a early pregnancy versus miscarriage.  Did explain everything to the patient.  She is to follow-up with her doctor that she already has an appointment with.  Also explained to her that they may want her to come to the office to be evaluated earlier for a beta hCG.  She is in agreement treatment plan.  Strict instructions to return if worsening.  Discharged in stable condition   FINAL CLINICAL IMPRESSION(S) / ED DIAGNOSES   Final diagnoses:  Vaginal bleeding in pregnancy     Rx / DC Orders   ED Discharge Orders     None        Note:  This document was prepared using Dragon voice recognition software and may include unintentional dictation errors.    Versie Starks, PA-C 10/10/22 2202    Arta Silence, MD 10/10/22 [phone]

## 2022-10-10 NOTE — Discharge Instructions (Signed)
Follow-up with your regular doctor.  Please call for an appointment. You do have some white cells in your urine however without having dysuria I do not want to treat for UTI.  Please follow-up with your doctor concerning these findings.  Return emergency department worsening

## 2022-10-10 NOTE — ED Triage Notes (Signed)
Pt reports vaginal spotting for 3 days, today bleeding became heavier and passed clots.  Pt is approx [redacted] weeks pregnant. Pt denies abd pain or cramping.  Denies urinary sx.  Pt alert  tearful in triage.  Recent miscarriage 11/23.

## 2022-10-11 LAB — POC URINE PREG, ED: Preg Test, Ur: POSITIVE — AB

## 2023-03-16 ENCOUNTER — Emergency Department
Admission: EM | Admit: 2023-03-16 | Discharge: 2023-03-16 | Disposition: A | Discharge status: Home / Self Care | Payer: Managed Care, Other (non HMO) | Attending: Emergency Medicine | Admitting: Emergency Medicine

## 2023-03-16 ENCOUNTER — Emergency Department: Payer: Managed Care, Other (non HMO)

## 2023-03-16 ENCOUNTER — Other Ambulatory Visit: Payer: Self-pay

## 2023-03-16 DIAGNOSIS — R0602 Shortness of breath: Secondary | ICD-10-CM | POA: Insufficient documentation

## 2023-03-16 DIAGNOSIS — R1011 Right upper quadrant pain: Secondary | ICD-10-CM | POA: Diagnosis not present

## 2023-03-16 LAB — CBC
HCT: 40 % (ref 36.0–46.0)
Hemoglobin: 12.9 g/dL (ref 12.0–15.0)
MCH: 28.6 pg (ref 26.0–34.0)
MCHC: 32.3 g/dL (ref 30.0–36.0)
MCV: 88.7 fL (ref 80.0–100.0)
Platelets: 250 10*3/uL (ref 150–400)
RBC: 4.51 MIL/uL (ref 3.87–5.11)
RDW: 13.2 % (ref 11.5–15.5)
WBC: 8.9 10*3/uL (ref 4.0–10.5)
nRBC: 0 % (ref 0.0–0.2)

## 2023-03-16 LAB — URINALYSIS, ROUTINE W REFLEX MICROSCOPIC
Bilirubin Urine: NEGATIVE
Glucose, UA: NEGATIVE mg/dL
Hgb urine dipstick: NEGATIVE
Leukocytes,Ua: NEGATIVE
Nitrite: NEGATIVE
Protein, ur: NEGATIVE mg/dL
Specific Gravity, Urine: 1.025 (ref 1.005–1.030)
pH: 5 (ref 5.0–8.0)

## 2023-03-16 LAB — HEPATIC FUNCTION PANEL
ALT: 15 U/L (ref 0–44)
AST: 16 U/L (ref 15–41)
Albumin: 4 g/dL (ref 3.5–5.0)
Alkaline Phosphatase: 77 U/L (ref 38–126)
Bilirubin, Direct: <0.1 mg/dL (ref 0.0–0.2)
Total Bilirubin: 0.5 mg/dL (ref 0.3–1.2)
Total Protein: 8.2 g/dL — ABNORMAL HIGH (ref 6.5–8.1)

## 2023-03-16 LAB — BASIC METABOLIC PANEL
Anion gap: 10 (ref 5–15)
BUN: 17 mg/dL (ref 6–20)
CO2: 25 mmol/L (ref 22–32)
Calcium: 9 mg/dL (ref 8.9–10.3)
Chloride: 102 mmol/L (ref 98–111)
Creatinine, Ser: 0.87 mg/dL (ref 0.44–1.00)
GFR, Estimated: >60 mL/min (ref >60)
Glucose, Bld: 95 mg/dL (ref 70–99)
Potassium: 3.8 mmol/L (ref 3.5–5.1)
Sodium: 137 mmol/L (ref 135–145)

## 2023-03-16 LAB — D-DIMER, QUANTITATIVE: D-Dimer, Quant: <0.27 ug{FEU}/mL (ref 0.00–0.50)

## 2023-03-16 LAB — TROPONIN I (HIGH SENSITIVITY): Troponin I (High Sensitivity): 3 ng/L (ref <18)

## 2023-03-16 LAB — LIPASE, BLOOD: Lipase: 30 U/L (ref 11–51)

## 2023-03-16 LAB — POC URINE PREG, ED: Preg Test, Ur: NEGATIVE

## 2023-03-16 MED ORDER — SODIUM CHLORIDE 0.9 % IV BOLUS
1000.0000 mL | Freq: Once | INTRAVENOUS | Status: AC
  Administered 2023-03-16: 1000 mL via INTRAVENOUS

## 2023-03-16 MED ORDER — KETOROLAC TROMETHAMINE 15 MG/ML IJ SOLN
15.0000 mg | Freq: Once | INTRAMUSCULAR | Status: AC
  Administered 2023-03-16: 15 mg via INTRAVENOUS
  Filled 2023-03-16: qty 1

## 2023-03-16 MED ORDER — METHOCARBAMOL 500 MG PO TABS
500.0000 mg | ORAL_TABLET | Freq: Three times a day (TID) | ORAL | 0 refills | Status: AC | PRN
Start: 2023-03-16 — End: 2023-03-21

## 2023-03-16 NOTE — ED Notes (Signed)
Ultrasound at bedside

## 2023-03-16 NOTE — ED Provider Notes (Signed)
[facility] Provider Note    Event Date/Time   First MD Initiated Contact with Patient 03/16/23 1811     (approximate)   History   Chief Complaint: Flank Pain   HPI  Nancy Sloan is a 37 y.o. female with no significant past medical history who comes ED complaining of right upper quadrant abdominal pain radiating to the back that started yesterday.  Constant, waxing waning, denies aggravating or alleviating factors.  Eating and drinking normally.  Feels sharp.  Feels worse with deep breathing.  Endorses some mild shortness of breath.  No chest pain otherwise.     Physical Exam   Triage Vital Signs: ED Triage Vitals [03/16/23 1631]  Encounter Vitals Group     BP (!) 154/71     Systolic BP Percentile      Diastolic BP Percentile      Pulse Rate 94     Resp 18     Temp 98.6 F (37 C)     Temp Source Oral     SpO2 96 %     Weight      Height      Head Circumference      Peak Flow      Pain Score 5     Pain Loc      Pain Education      Exclude from Growth Chart     Most recent vital signs: Vitals:   03/16/23 1631  BP: (!) 154/71  Pulse: 94  Resp: 18  Temp: 98.6 F (37 C)  SpO2: 96%    General: Awake, no distress.  CV:  Good peripheral perfusion.  Regular rate and rhythm Resp:  Normal effort.  Clear to auscultation bilaterally Abd:  No distention.  Soft, nontender except for mild discomfort in the right upper quadrant with deep palpation Other:  No lower extremity edema or calf tenderness   ED Results / Procedures / Treatments   Labs (all labs ordered are listed, but only abnormal results are displayed) Labs Reviewed  URINALYSIS, ROUTINE W REFLEX MICROSCOPIC - Abnormal; Notable for the following components:      Result Value   Ketones, ur TRACE (*)    All other components within normal limits  BASIC METABOLIC PANEL  CBC  LIPASE, BLOOD  HEPATIC FUNCTION PANEL  D-DIMER, QUANTITATIVE  POC URINE PREG, ED  TROPONIN I  (HIGH SENSITIVITY)     EKG Interpreted by me Normal sinus rhythm rate of 79.  Normal axis and intervals.  Normal QRS ST segments and T waves   RADIOLOGY Chest x-ray interpreted by me, appears normal.  Radiology report reviewed   PROCEDURES:  Procedures   MEDICATIONS ORDERED IN ED: Medications  ketorolac (TORADOL) 15 MG/ML injection 15 mg (has no administration in time range)     IMPRESSION / MDM / ASSESSMENT AND PLAN / ED COURSE  I reviewed the triage vital signs and the nursing notes.  DDx: Pneumonia, pleural effusion, pneumothorax, cholelithiasis, pancreatitis, GERD, pulmonary embolism.  Doubt ACS or dissection  Patient's presentation is most consistent with acute presentation with potential threat to life or bodily function.  Patient presents with right upper quadrant/right lower chest pain, atypical in nature, doubt ACS.  Vitals and EKG are normal, initial labs are normal.  Exam is somewhat limited by morbid obesity.  Will add on LFTs, D-dimer, obtain chest x-ray and right upper quadrant ultrasound.       FINAL CLINICAL IMPRESSION(S) / ED DIAGNOSES   Final diagnoses:  Right upper quadrant abdominal pain     Rx / DC Orders   ED Discharge Orders     None        Note:  This document was prepared using Dragon voice recognition software and may include unintentional dictation errors.   Sharman Cheek, MD 03/16/23 [phone]

## 2023-03-16 NOTE — ED Provider Notes (Signed)
Assumed patient care from Alfonse Flavors, MD.  Hepatic function panel within range.  Lipase within range.  Right upper quadrant ultrasound unremarkable.  D-dimer within range.  Upon recheck, patient was resting fairly comfortably.  Suspect musculoskeletal source for patient's discomfort.  Will recommend Robaxin to 3 times daily for the next 5 days.  Return precautions were given to return with new or worsening symptoms.   Nancy Sloan 03/16/23 2104    Sharman Cheek, MD 03/19/23 [phone]

## 2023-03-16 NOTE — ED Notes (Signed)
Patient asked for something to drink in a pressured speech. Patient's face was red, slightly diaphoretic on forehead. Oral temp. Was 98.3 degrees F. VSS. Patient was given a cool, wet cloth to use at pulse points. Leonides Schanz, PA-C aware.

## 2023-03-16 NOTE — ED Triage Notes (Addendum)
Pt c/o right flank/side pain that radiates into the right shoulder blade and nausea since yesterday. Pt AOX4, NAD noted. Pt denies urinary s/s.

## 2023-03-22 ENCOUNTER — Ambulatory Visit
Admission: EM | Admit: 2023-03-22 | Discharge: 2023-03-22 | Disposition: A | Discharge status: Home / Self Care | Payer: Managed Care, Other (non HMO) | Attending: Internal Medicine | Admitting: Internal Medicine

## 2023-03-22 DIAGNOSIS — B029 Zoster without complications: Secondary | ICD-10-CM

## 2023-03-22 MED ORDER — VALACYCLOVIR HCL 1 G PO TABS
1000.0000 mg | ORAL_TABLET | Freq: Three times a day (TID) | ORAL | 0 refills | Status: AC
Start: 2023-03-22 — End: 2023-03-29

## 2023-03-22 NOTE — Discharge Instructions (Signed)
Valtrex 3 times a day for 7 days.  You may continue calamine lotion over-the-counter as needed.  Follow-up with your PCP next week for recheck and further treatment options if your symptoms persist.  Please go to emergency room for any worsening symptoms.  I hope you feel better soon!

## 2023-03-22 NOTE — ED Provider Notes (Signed)
Renaldo Fiddler    CSN: [id number] Arrival date & time: 03/22/23  1643      History   Chief Complaint Chief Complaint  Patient presents with   Rash    HPI Nancy Sloan is a 37 y.o. female presents for evaluation of a rash.  Patient reports 3 to 4 days of a burning red rash that starts at the mid back and wraps around to her mid abdomen.  Denies any fevers or chills.  She was seen in the emergency room on August 31 for right upper quadrant abdominal pain.  She had a negative workup including blood work, troponin, chest x-ray, right upper quadrant ultrasound.  She was told it could be shingles but the rash did not yet appear to give her methocarbamol to treat for possible musculoskeletal cause.  She reports she took 1 dose without any improvement.  She has been using calamine lotion.  She did have chickenpox as a child.  No other concerns at this time.   Rash   Past Medical History:  Diagnosis Date   Anemia    Herpes genitalis in women     Patient Active Problem List   Diagnosis Date Noted   BMI 50.0-59.9, adult (HCC) 03/12/2017    Past Surgical History:  Procedure Laterality Date   NO PAST SURGERIES      OB History     Gravida  3   Para  2   Term  2   Preterm      AB      Living  2      SAB      IAB      Ectopic      Multiple      Live Births  2            Home Medications    Prior to Admission medications   Medication Sig Start Date End Date Taking? Authorizing Provider  valACYclovir (VALTREX) 1000 MG tablet Take 1 tablet (1,000 mg total) by mouth 3 (three) times daily for 7 days. 03/22/23 03/29/23 Yes Radford Pax, NP  [facility] EVENING PRIMROSE OIL PO Take by mouth.    [provider]  FISH OIL-COENZYME Q10 PO Take 1 tablet by mouth daily.    [provider]    Family History Family History  Problem Relation Age of Onset   Thyroid disease Mother    Cancer - Cervical Mother    Heart disease Mother     Endometriosis Sister    Breast cancer Neg Hx    Ovarian cancer Neg Hx    Colon cancer Neg Hx    Diabetes Neg Hx     Social History Social History   Tobacco Use   Smoking status: Never   Smokeless tobacco: Never  Vaping Use   Vaping status: Never Used  Substance Use Topics   Alcohol use: Not Currently   Drug use: No     Allergies   Patient has no known allergies.   Review of Systems Review of Systems  Skin:  Positive for rash.     Physical Exam Triage Vital Signs ED Triage Vitals  Encounter Vitals Group     BP 03/22/23 1650 (!) 158/88     Systolic BP Percentile --      Diastolic BP Percentile --      Pulse Rate 03/22/23 1650 (!) 107     Resp 03/22/23 1650 18     Temp 03/22/23 1650 99 F (37.2  C)     Temp src --      SpO2 --      Weight --      Height --      Head Circumference --      Peak Flow --      Pain Score 03/22/23 1708 3     Pain Loc --      Pain Education --      Exclude from Growth Chart --    No data found.  Updated Vital Signs BP (!) 158/88   Pulse (!) 107   Temp 99 F (37.2 C)   Resp 18   LMP 02/11/2023 (Exact Date)   Breastfeeding No   Visual Acuity Right Eye Distance:   Left Eye Distance:   Bilateral Distance:    Right Eye Near:   Left Eye Near:    Bilateral Near:     Physical Exam Vitals and nursing note reviewed.  Constitutional:      General: She is not in acute distress.    Appearance: Normal appearance. She is obese. She is not ill-appearing.  HENT:     Head: Normocephalic and atraumatic.  Eyes:     Pupils: Pupils are equal, round, and reactive to light.  Cardiovascular:     Rate and Rhythm: Tachycardia present.     Comments: Mildly tacky at 107 Pulmonary:     Effort: Pulmonary effort is normal.  Skin:    General: Skin is warm and dry.     Findings: Rash present. Rash is vesicular.          Comments: There is a erythematous vesicular rash that starts at the left mid back and wraps around to the left upper  quadrant.  No swelling, drainage, warmth.  Rash does not cross midline of body.  Neurological:     General: No focal deficit present.     Mental Status: She is alert and oriented to person, place, and time.  Psychiatric:        Mood and Affect: Mood normal.        Behavior: Behavior normal.      UC Treatments / Results  Labs (all labs ordered are listed, but only abnormal results are displayed) Labs Reviewed - No data to display  EKG   Radiology No results found.  Procedures Procedures (including critical care time)  Medications Ordered in UC Medications - No data to display  Initial Impression / Assessment and Plan / UC Course  I have reviewed the triage vital signs and the nursing notes.  Pertinent labs & imaging results that were available during my care of the patient were reviewed by me and considered in my medical decision making (see chart for details).     Reviewed exam and symptoms with patient.  No red flags.  Discussed shingles rash.  Start Valtrex.  Continue calamine lotion as needed.  PCP follow-up next week for recheck and further treatment options should symptoms persist.  ER precautions reviewed and patient verbalized understanding Final Clinical Impressions(s) / UC Diagnoses   Final diagnoses:  Herpes zoster without complication     Discharge Instructions      Valtrex 3 times a day for 7 days.  You may continue calamine lotion over-the-counter as needed.  Follow-up with your PCP next week for recheck and further treatment options if your symptoms persist.  Please go to emergency room for any worsening symptoms.  I hope you feel better soon!    ED Prescriptions  Medication Sig Dispense Auth. Provider   valACYclovir (VALTREX) 1000 MG tablet Take 1 tablet (1,000 mg total) by mouth 3 (three) times daily for 7 days. 21 tablet Radford Pax, NP      PDMP not reviewed this encounter.   Radford Pax, NP 03/22/23 [phone]

## 2023-03-22 NOTE — ED Triage Notes (Signed)
Patient presents to UC for possible shingles to right later rib area. States she was seen at the ED 08/31. She was prescribed muscle relaxer. States treating pain with tylenol, muscle relaxer with no relief. She is a Engineer, site.

## 2023-04-01 ENCOUNTER — Encounter: Payer: Self-pay | Admitting: Physical Therapy

## 2023-04-01 ENCOUNTER — Ambulatory Visit: Payer: Managed Care, Other (non HMO) | Admitting: Physical Therapy

## 2023-04-01 DIAGNOSIS — R2689 Other abnormalities of gait and mobility: Secondary | ICD-10-CM | POA: Insufficient documentation

## 2023-04-01 DIAGNOSIS — M5459 Other low back pain: Secondary | ICD-10-CM | POA: Diagnosis present

## 2023-04-01 DIAGNOSIS — M533 Sacrococcygeal disorders, not elsewhere classified: Secondary | ICD-10-CM | POA: Insufficient documentation

## 2023-04-01 NOTE — Patient Instructions (Signed)
Standing with 4 points, ballmounds  AND heels not just the heels, Unlock knees   __  Sitting with feet on the ground  __   Proper body mechanics with getting out of a chair to decrease strain  on back &pelvic floor   Avoid holding your breath when Getting out of the chair:  Scoot to front part of chair chair Heels behind knees, feet are hip width apart, nose over toes  Inhale like you are smelling roses Exhale to stand   __

## 2023-04-01 NOTE — Therapy (Signed)
OUTPATIENT PHYSICAL THERAPY EVALUATION   Patient Name: Nancy Sloan MRN: [id number] DOB:05-07-1986, 37 y.o., female Today's Date: 04/01/2023   PT End of Session - 04/01/23 1023     Visit Number 1    Number of Visits 10    Date for PT Re-Evaluation 06/10/23    PT Start Time 1017    PT Stop Time 1100    PT Time Calculation (min) 43 min    Activity Tolerance Patient tolerated treatment well;No increased pain    Behavior During Therapy WFL for tasks assessed/performed             Past Medical History:  Diagnosis Date   Anemia    Herpes genitalis in women    Past Surgical History:  Procedure Laterality Date   NO PAST SURGERIES     Patient Active Problem List   Diagnosis Date Noted   BMI 50.0-59.9, adult (HCC) 03/12/2017    PCP:  Shelton Silvas   REFERRING PROVIDER: Tami Lin CNM  REFERRING DIAG: Continuous leakage, pelvic pain   Rationale for Evaluation and Treatment Habilitation  THERAPY DIAG:  Sacrococcygeal disorders, not elsewhere classified  Other abnormalities of gait and mobility  Other low back pain  ONSET DATE:   SUBJECTIVE:                                                                                                                                                                                           SUBJECTIVE STATEMENT: 1) bubbles  through vagina: started in February 2024 and not now constant. It occurs after peeing  and have walked around , she feels little air bubbles leave. Pt sometimes she notices when she sits down, leaning back more or when popping leg under her. Not as much when lying down but occasionally it does occurs.  Pt has not had this sensation before.  Pt notices when passing gas, "it rolls up" in the rectum and gas gets trapped.   Pt had  miscarriages in November 2023 and March 2024. Hx of fall onto the tailbone in the last 6 months from a hammock .   2) CLBP : starts after sitting > 4-6 hrs at work and standing > 1 hr. Pain  occurs across LBP and hip bone, travels down R side front thigh to knee level. 3/10 , eases with position changes with lying on L side, propping pillow under back when on her back.  Completed PT for midback pain which improved by 60%.   Fitness: walking 20-30 min  3) constipation:  bowel movements occurs every 2 days lately with difficulty completing and have had to try to use her finger  to get it out. Stool type 4 occurs 50% of the time and Type 1-2 the other 50%   4) incomplete urination: She feels dribble, she sits on the toilet for a while after peeing the first. She leans forward to completely empty the bladder. Pt notices her underwear is wet afterwards. Pt completed Pelvic PT it helped. Improvement 25-50% with Pelvic PT in the past.      PERTINENT HISTORY:  Pt is getting over a bout of shingles. Pt had 2 vaginal deliveries, episiotomy with 2-3rd perineal tear.   PAIN:  Are you having pain? Yes: see above  PRECAUTIONS: None  WEIGHT BEARING RESTRICTIONS: No  FALLS:  Has patient fallen in last 6 months? Yes, out of hammock   LIVING ENVIRONMENT: Lives with: family  Lives in: House/apartment Stairs: 4 STE no rail   OCCUPATION:  teacher   PLOF: Independent  PATIENT GOALS:  Help with air bubbles, dripping, LBP   OBJECTIVE:    [facility] PT Assessment - 04/01/23 1053       Sit to Stand   Comments breathholding , locking knees on rise      Strength   Overall Strength Comments L hip flexion 4-/5, R 5/5      Palpation   Spinal mobility R sideflexion limited digit III to floor 48 cm, L 52 cm with report of radiating R thigh pain    SI assessment  R shoulder/ iliac crest higher      Ambulation/Gait   Gait Comments 1.13 m/s, decreased L stance, minimal trunk rotation             [facility] Adult PT Treatment/Exercise - 04/01/23 1053       Posture/Postural Control   Posture Comments hyperextended knee in standing      Therapeutic Activites    Therapeutic Activities Other  Therapeutic Activities    Other Therapeutic Activities explained whole person apporach to Tx, explaiend depe core system, IAP to improve Sx      Neuro Re-ed    Neuro Re-ed Details  cued for body mechanics for upright posture, standing posture to minimize LBP , propioception of feet               HOME EXERCISE PROGRAM: See pt instruction section    ASSESSMENT:  CLINICAL IMPRESSION:    Pt is a  37  yo  who presents with  air passage through vagina, CLBP , constipation, incomplete completing of bowel movement/ urine which impact QOL, ADL, fitness, and community activities.   Pt's musculoskeletal assessment revealed uneven pelvic girdle and shoulder height, asymmetries to gait pattern, limited spinal /pelvic mobility, dyscoordination and strength of pelvic floor mm, hip weakness, poor body mechanics which places strain on the abdominal/pelvic floor mm. These are deficits that indicate an ineffective intraabdominal pressure system associated with increased risk for pt's Sx.   Pt was provided education on etiology of Sx with anatomy, physiology explanation with images along with the benefits of customized pelvic PT Tx based on pt's medical conditions and musculoskeletal deficits.  Explained the physiology of deep core mm coordination and roles of pelvic floor function in urination, defecation, sexual function, and postural control with deep core mm system.   Regional interdependent approaches will yield greater benefits in pt's POC.   Following Tx today which pt tolerated without complaints,  pt demo'd proper body mechanics to minimize straining pelvic floor.  Plan to address realignment of spine/ pelvis at next session to help optimize IAP system for improved  pelvic floor function.  Plan to address pelvic floor issues once pelvis and spine are realigned to yield better outcomes. Assess talbone and SIJ alignment given pt's Hx of fall onto tailbone.  Pt benefits from skilled PT.     OBJECTIVE IMPAIRMENTS decreased activity tolerance, decreased coordination, decreased endurance, decreased mobility, difficulty walking, decreased ROM, decreased strength, decreased safety awareness, hypomobility, increased muscle spasms, impaired flexibility, improper body mechanics, postural dysfunction, and pain. scar restrictions   ACTIVITY LIMITATIONS  self-care,  sleep, home chores, work tasks    PARTICIPATION LIMITATIONS:  community, gym activities    PERSONAL FACTORS        are also affecting patient's functional outcome.    REHAB POTENTIAL: Good   CLINICAL DECISION MAKING: Evolving/moderate complexity   EVALUATION COMPLEXITY: Moderate    PATIENT EDUCATION:    Education details: Showed pt anatomy images. Explained muscles attachments/ connection, physiology of deep core system/ spinal- thoracic-pelvis-lower kinetic chain as they relate to pt's presentation, Sx, and past Hx. Explained what and how these areas of deficits need to be restored to balance and function    See Therapeutic activity / neuromuscular re-education section  Answered pt's questions.   Person educated: Patient Education method: Explanation, Demonstration, Tactile cues, Verbal cues, and Handouts Education comprehension: verbalized understanding, returned demonstration, verbal cues required, tactile cues required, and needs further education     PLAN: PT FREQUENCY: 1x/week   PT DURATION: 10 weeks   PLANNED INTERVENTIONS: Therapeutic exercises, Therapeutic activity, Neuromuscular re-education, Balance training, Gait training, Patient/Family education, Self Care, Joint mobilization, Spinal mobilization, Moist heat, Taping, and Manual therapy, dry needling.   PLAN FOR NEXT SESSION: See clinical impression for plan     GOALS: Goals reviewed with patient? Yes  SHORT TERM GOALS: Target date: 04/29/2023    Pt will demo IND with HEP                    Baseline: Not IND            Goal status:  INITIAL   LONG TERM GOALS: Target date: 06/10/2023    1.Pt will demo proper deep core coordination without chest breathing and optimal excursion of diaphragm/pelvic floor in order to promote spinal stability and pelvic floor function  Baseline: dyscoordination Goal status: INITIAL  2.  Pt will demo > 5 pt change on FOTO  to improve QOL and function    Urinary Problem baseline-   52 Higher score =   better function    Bowel  constipation baseline - 60   Higher score = better function    Lumber baseline  - 69 Higher score = better function   Goal status: INITIAL  3.  Pt will demo proper body mechanics in against gravity tasks and ADLs  work tasks, fitness  to minimize straining pelvic floor / back    Baseline: not IND, improper form that places strain on pelvic floor  Goal status: INITIAL    4. Pt will demo increased gait speed > 1.3 m/s with reciprocal gait pattern, longer stride length  in order to ambulate safely in community and return to fitness routine  Baseline:  Goal status: INITIAL    5. Pt will report < 25%  of occurrence  air passage through vagina after peeing, sitting with proper sitting mechanics, and walking around across 1 week.   Baseline:  It occurs 100% of the time  after peeing  and have walked around , she feels little bubbles leave. Pt  sometimes she notices when she sits down, leaning back more or when popping leg under her. Not as much when lying down but occasionally it does occurs.  Goal status: INITIAL   6. Pt will report standing > 1 hr without pain, minor discomfort 1-2/10 and no radiating pain down R thigh in order to perform work and community activities  Baseline:  after sitting > 4-6 hrs at work and standing > 1 hr. Pain occurs across LBP and hip bone, travels down R side front thigh to knee level. 3/10 , eases with position changes with lying on L side, propping pillow under back when on her back.  Goal status: INITIAL  7.  Pt will  report daily BMs and complete BMs without using finger  Baseline:  bowel movements occurs every 2 days lately with difficulty completing and have had to try to use her finger to get it out.  Goal status: INITIAL  8. Pt will report stool type 4 across> 75% of the time and no straining  Baseline:Stool type 4 occurs 50% of the time and Type 1-2 the other 50%  Goals status:   INITIAL  9. Pt will demo levelled pelvic girdle and shoulder height in order to progress to deep core strengthening HEP and restore mobility at spine, pelvis, gait, posture minimize falls, and improve balance   Baseline:R shoulder. Iliac crest higher Goals status:   INITIAL  Mariane Masters, PT 04/01/2023, 10:27 AM

## 2023-04-08 ENCOUNTER — Ambulatory Visit: Payer: Managed Care, Other (non HMO) | Admitting: Physical Therapy

## 2023-04-14 ENCOUNTER — Encounter: Payer: Self-pay | Admitting: Physical Therapy

## 2023-04-15 ENCOUNTER — Ambulatory Visit: Payer: Managed Care, Other (non HMO) | Admitting: Physical Therapy

## 2023-04-22 ENCOUNTER — Ambulatory Visit: Payer: Managed Care, Other (non HMO) | Admitting: Physical Therapy

## 2023-04-29 ENCOUNTER — Encounter: Payer: Managed Care, Other (non HMO) | Admitting: Physical Therapy

## 2023-05-06 ENCOUNTER — Telehealth: Payer: Self-pay | Admitting: Physical Therapy

## 2023-05-06 ENCOUNTER — Ambulatory Visit: Payer: Managed Care, Other (non HMO) | Admitting: Physical Therapy

## 2023-05-06 DIAGNOSIS — M533 Sacrococcygeal disorders, not elsewhere classified: Secondary | ICD-10-CM | POA: Insufficient documentation

## 2023-05-06 DIAGNOSIS — R2689 Other abnormalities of gait and mobility: Secondary | ICD-10-CM | POA: Insufficient documentation

## 2023-05-06 DIAGNOSIS — M5459 Other low back pain: Secondary | ICD-10-CM | POA: Insufficient documentation

## 2023-05-06 NOTE — Progress Notes (Signed)
Physical therapist called pt   Left message that stating that there are a few remaining appts and wanted to check in if pt would like these appts with the understanding that pt had to cancel the past few due [place]  [facility]. Hoping the best recovery and safety for her family.   Asked if pt can please call to confirm whether they wish to keep or cancel remaining appts [phone].  Remaining appts will be removed after 2 no-show appts per rehab department policy.

## 2023-05-06 NOTE — Telephone Encounter (Signed)
Physical therapist called pt   Left message that stating that there are a few remaining appts and wanted to check in if pt would like these appts with the understanding that pt had to cancel the past few due to [place]  [facility]. Hoping the best recovery and safety for her family.   Asked if pt can please call to confirm whether they wish to keep or cancel remaining appts [phone].  Remaining appts will be removed after 2 no-show appts per rehab department policy.

## 2023-05-13 ENCOUNTER — Ambulatory Visit: Payer: Managed Care, Other (non HMO) | Admitting: Physical Therapy

## 2023-05-13 DIAGNOSIS — R2689 Other abnormalities of gait and mobility: Secondary | ICD-10-CM | POA: Diagnosis present

## 2023-05-13 DIAGNOSIS — M533 Sacrococcygeal disorders, not elsewhere classified: Secondary | ICD-10-CM

## 2023-05-13 DIAGNOSIS — M5459 Other low back pain: Secondary | ICD-10-CM

## 2023-05-13 NOTE — Patient Instructions (Signed)
Feet care :  Self -feet massage   Handshake : fingers between toes, moving ballmounds/toes back and forth several times while other hand anchors at arch. Do the same at the hind/mid foot.  Heel to toes upward to a letter Big Letter T strokes to spread ballmounds and toes, several times, pinch between webs of toes  Run finger tips along top of foot between long bones "comb between the bones"    Wiggle toes and spread them out when relaxing   __   Feet slides :   Points of contact at sitting bones  Four points of contact of foot,  Side knee back while keeping knee out along 2-3rd toe line   Heel up, ankle not twist out Lower heel while keeping knee out along 2-3rd toe line Four points of contact of foot, Slide foot back while keeping knee out along 2-3rd toe line   Repeated with other foot   __   Walking with higher k nees, ballmounds landing,   Standing with more ballmounds and heel points of contact, and not just heels-locked knees NOTICE center of mass ( belly button) shift forward a little bit to weight bear through  ballmounds

## 2023-05-13 NOTE — Therapy (Signed)
OUTPATIENT PHYSICAL THERAPY Treatment    Patient Name: Nancy Sloan MRN: [id number] DOB:October 09, 1985, 37 y.o., female Today's Date: 05/13/2023   PT End of Session - 05/13/23 0853     Visit Number 2    Number of Visits 10    Date for PT Re-Evaluation 06/10/23    PT Start Time 0850    PT Stop Time 0930    PT Time Calculation (min) 40 min    Activity Tolerance Patient tolerated treatment well;No increased pain    Behavior During Therapy WFL for tasks assessed/performed             Past Medical History:  Diagnosis Date   Anemia    Herpes genitalis in women    Past Surgical History:  Procedure Laterality Date   NO PAST SURGERIES     Patient Active Problem List   Diagnosis Date Noted   BMI 50.0-59.9, adult (HCC) 03/12/2017    PCP:  Shelton Silvas   REFERRING PROVIDER: Tami Lin CNM  REFERRING DIAG: Continuous leakage, pelvic pain   Rationale for Evaluation and Treatment Habilitation  THERAPY DIAG:  Sacrococcygeal disorders, not elsewhere classified  Other abnormalities of gait and mobility  Other low back pain  ONSET DATE:   SUBJECTIVE:                   SUBJECTIVE STATEMENT TODAY:  Pt reports her L knee hurt, with bending and twist, the side of her knee popped out and it slide in place to straighten, it goes back in.  Pt had severe ankle sprain on L ankle. Pt fell and heard a pop. There was no broken bones with the fall.  CLBP has improved from 3/10 to 1/0  on R side and has not noticed any radiating pain over the past month.                                                                                                                                                                   SUBJECTIVE STATEMENT ON EVAL 04/01/23 : 1) bubbles  through vagina: started in February 2024 and not now constant. It occurs after peeing  and have walked around , she feels little air bubbles leave. Pt sometimes she notices when she sits down, leaning back more or when popping  leg under her. Not as much when lying down but occasionally it does occurs.  Pt has not had this sensation before.  Pt notices when passing gas, "it rolls up" in the rectum and gas gets trapped.   Pt had  miscarriages in November 2023 and March 2024. Hx of fall onto the tailbone in the last 6 months from a hammock .   2) CLBP : starts after sitting > 4-6 hrs at work and standing >  1 hr. Pain occurs across LBP and hip bone, travels down R side front thigh to knee level. 3/10 , eases with position changes with lying on L side, propping pillow under back when on her back.  Completed PT for midback pain which improved by 60%.   Fitness: walking 20-30 min  3) constipation:  bowel movements occurs every 2 days lately with difficulty completing and have had to try to use her finger to get it out. Stool type 4 occurs 50% of the time and Type 1-2 the other 50%   4) incomplete urination: She feels dribble, she sits on the toilet for a while after peeing the first. She leans forward to completely empty the bladder. Pt notices her underwear is wet afterwards. Pt completed Pelvic PT it helped. Improvement 25-50% with Pelvic PT in the past.      PERTINENT HISTORY:  Pt is getting over a bout of shingles. Pt had 2 vaginal deliveries, episiotomy with 2-3rd perineal tear.   PAIN:  Are you having pain? Yes: see above  PRECAUTIONS: None  WEIGHT BEARING RESTRICTIONS: No  FALLS:  Has patient fallen in last 6 months? Yes, out of hammock   LIVING ENVIRONMENT: Lives with: family  Lives in: House/apartment Stairs: 4 STE no rail   OCCUPATION:  teacher   PLOF: Independent  PATIENT GOALS:  Help with air bubbles, dripping, LBP   OBJECTIVE:    [facility] PT Assessment - 05/13/23 0906       Other:   Other/ Comments simulated loading dishwaser: deep squat with pivot which causde the knee pain,      Strength   Overall Strength Comments UE support, L heel raises MMT 2/5 2 reps, R MMT 3/5 8 reps  , medial  collapse of medial arches   L great toe/ digit II-IV toe extension 3/5, R 4/5 ( post Tx: L improved 4/5 )        Palpation   SI assessment  levelled pelvis./ shoulder             [facility] Adult PT Treatment/Exercise - 05/13/23 0908       Therapeutic Activites    Other Therapeutic Activities explained to pick up feet before twist body, cued not locking knees      Neuro Re-ed    Neuro Re-ed Details  cued for proper squat , knee alignment, feet alignment               HOME EXERCISE PROGRAM: See pt instruction section    ASSESSMENT:  CLINICAL IMPRESSION:  Improvements:  Pt showed good carry over over the past month since first appt with levelled spine/ pelvis which will help optimize IAP system for improved pelvic floor function.  CLBP has improved from 3/10 to 1/0  on R side and has not noticed any radiating pain over the past month.   Addressed her L knee pain which is due to poor body mechanics with bending,.twisting. Pt demo'd improved body mechanics post training which will also help with preventing back pain and knee pain. Further addressed lower kinetic chain which showed IR on knee, supination, collapsed arches, limited toe abduction B. Manual Tx addressed these deficits which will help with long term benefits and further help pt maintain her walking routine with less risk for injuries and further improve LBP. Pt had past L ankle injury after a fall in the past which is related to her deficits.       Pt demo'd improved great toe, digit II-IV  toe extensions strength post Tx which will help with push off in gait pattern. Pt required gait training cues to promote more mobility at SIJ and LKC.      Plan to reassess PF strength at next session and initiate deep core training  Plan to address pelvic floor issues once pelvis and spine are realigned to yield better outcomes.   Pt benefits from skilled PT.    OBJECTIVE IMPAIRMENTS decreased activity tolerance, decreased  coordination, decreased endurance, decreased mobility, difficulty walking, decreased ROM, decreased strength, decreased safety awareness, hypomobility, increased muscle spasms, impaired flexibility, improper body mechanics, postural dysfunction, and pain. scar restrictions   ACTIVITY LIMITATIONS  self-care,  sleep, home chores, work tasks    PARTICIPATION LIMITATIONS:  community, gym activities    PERSONAL FACTORS        are also affecting patient's functional outcome.    REHAB POTENTIAL: Good   CLINICAL DECISION MAKING: Evolving/moderate complexity   EVALUATION COMPLEXITY: Moderate    PATIENT EDUCATION:    Education details: Showed pt anatomy images. Explained muscles attachments/ connection, physiology of deep core system/ spinal- thoracic-pelvis-lower kinetic chain as they relate to pt's presentation, Sx, and past Hx. Explained what and how these areas of deficits need to be restored to balance and function    See Therapeutic activity / neuromuscular re-education section  Answered pt's questions.   Person educated: Patient Education method: Explanation, Demonstration, Tactile cues, Verbal cues, and Handouts Education comprehension: verbalized understanding, returned demonstration, verbal cues required, tactile cues required, and needs further education     PLAN: PT FREQUENCY: 1x/week   PT DURATION: 10 weeks   PLANNED INTERVENTIONS: Therapeutic exercises, Therapeutic activity, Neuromuscular re-education, Balance training, Gait training, Patient/Family education, Self Care, Joint mobilization, Spinal mobilization, Moist heat, Taping, and Manual therapy, dry needling.   PLAN FOR NEXT SESSION: See clinical impression for plan     GOALS: Goals reviewed with patient? Yes  SHORT TERM GOALS: Target date: 04/29/2023    Pt will demo IND with HEP                    Baseline: Not IND            Goal status: INITIAL   LONG TERM GOALS: Target date: 06/10/2023    1.Pt will  demo proper deep core coordination without chest breathing and optimal excursion of diaphragm/pelvic floor in order to promote spinal stability and pelvic floor function  Baseline: dyscoordination Goal status: INITIAL  2.  Pt will demo > 5 pt change on FOTO  to improve QOL and function    Urinary Problem baseline-   52 Higher score =   better function    Bowel  constipation baseline - 60   Higher score = better function    Lumber baseline  - 69 Higher score = better function   Goal status: INITIAL  3.  Pt will demo proper body mechanics in against gravity tasks and ADLs  work tasks, fitness  to minimize straining pelvic floor / back    Baseline: not IND, improper form that places strain on pelvic floor  Goal status: INITIAL    4. Pt will demo increased gait speed > 1.3 m/s with reciprocal gait pattern, longer stride length  in order to ambulate safely in community and return to fitness routine  Baseline:  Goal status: INITIAL    5. Pt will report < 25%  of occurrence  air passage through vagina after peeing, sitting with proper sitting mechanics,  and walking around across 1 week.   Baseline:  It occurs 100% of the time  after peeing  and have walked around , she feels little bubbles leave. Pt sometimes she notices when she sits down, leaning back more or when popping leg under her. Not as much when lying down but occasionally it does occurs.  Goal status: INITIAL   6. Pt will report standing > 1 hr without pain, minor discomfort 1-2/10 and no radiating pain down R thigh in order to perform work and community activities  Baseline:  after sitting > 4-6 hrs at work and standing > 1 hr. Pain occurs across LBP and hip bone, travels down R side front thigh to knee level. 3/10 , eases with position changes with lying on L side, propping pillow under back when on her back.  Goal status: INITIAL  7.  Pt will report daily BMs and complete BMs without using finger  Baseline:   bowel movements occurs every 2 days lately with difficulty completing and have had to try to use her finger to get it out.  Goal status: INITIAL  8. Pt will report stool type 4 across> 75% of the time and no straining  Baseline:Stool type 4 occurs 50% of the time and Type 1-2 the other 50%  Goals status:   INITIAL  9. Pt will demo levelled pelvic girdle and shoulder height in order to progress to deep core strengthening HEP and restore mobility at spine, pelvis, gait, posture minimize falls, and improve balance   Baseline:R shoulder. Iliac crest higher Goals status:   MET  Mariane Masters, PT 05/13/2023, 8:54 AM

## 2023-05-20 ENCOUNTER — Ambulatory Visit: Payer: Managed Care, Other (non HMO) | Admitting: Physical Therapy

## 2023-05-27 ENCOUNTER — Ambulatory Visit: Payer: Managed Care, Other (non HMO) | Admitting: Physical Therapy

## 2023-05-27 DIAGNOSIS — R2689 Other abnormalities of gait and mobility: Secondary | ICD-10-CM | POA: Insufficient documentation

## 2023-05-27 DIAGNOSIS — M533 Sacrococcygeal disorders, not elsewhere classified: Secondary | ICD-10-CM | POA: Diagnosis present

## 2023-05-27 DIAGNOSIS — M5459 Other low back pain: Secondary | ICD-10-CM | POA: Diagnosis present

## 2023-05-27 NOTE — Patient Instructions (Signed)
   Neck / shoulder stretches:    Lying on back - small sushi roll towel under neck  _ 6 directions   Chin up, down Rotation like changing lanes when driving Ear to shoulder like puppy dog   5 reps   _angel wings, lower elbows down , keep arms touching bed  10 reps      _wall stretch Clock stretch with head turns :  stand perpendicular to the wall, L side to the wall Tilt head to wall  Place L palm at 7 o clock Chin tuck like you are looking into armpit Look at "[place] on giant map  Swivel head with chin tucked to look in upper corner of ceiling as if you are look at  [place] on giant map "   5 reps   Switch direction, R palm on wall at 5 o 'clock   Chin tuck like you are looking into armpit Look at "[place]  on giant map  Swivel head with chin tucked to look in upper corner of ceiling as if you are look at  [facility] on giant map "   5 reps    ___   Avoid straining pelvic floor, abdominal muscles , spine  Use log rolling technique instead of getting out of bed with your neck or the sit-up     Log rolling into and out of bed   Log rolling into and out of bed If getting out of bed on R side, Bent knees, scoot hips/ shoulder to L  Raise R arm completely overhead, rolling onto armpit  Then lower bent knees to bed to get into complete side lying position  Then drop legs off bed, and push up onto R elbow/forearm, and use L hand to push onto the bed    Dig elbows and feet to lift hte buttocks and scoot without lifting head

## 2023-05-27 NOTE — Therapy (Signed)
OUTPATIENT PHYSICAL THERAPY Treatment    Patient Name: Nancy Sloan MRN: [id number] DOB:05-27-1986, 37 y.o., female Today's Date: 05/27/2023   PT End of Session - 05/27/23 0300     Visit Number 3    Number of Visits 10    Date for PT Re-Evaluation 06/10/23    PT Start Time 0853    PT Stop Time 0935    PT Time Calculation (min) 42 min    Activity Tolerance Patient tolerated treatment well;No increased pain    Behavior During Therapy WFL for tasks assessed/performed             Past Medical History:  Diagnosis Date   Anemia    Herpes genitalis in women    Past Surgical History:  Procedure Laterality Date   NO PAST SURGERIES     Patient Active Problem List   Diagnosis Date Noted   BMI 50.0-59.9, adult (HCC) 03/12/2017    PCP:  Shelton Silvas   REFERRING PROVIDER: Tami Lin CNM  REFERRING DIAG: Continuous leakage, pelvic pain   Rationale for Evaluation and Treatment Habilitation  THERAPY DIAG:  Sacrococcygeal disorders, not elsewhere classified  Other abnormalities of gait and mobility  Other low back pain  ONSET DATE:   SUBJECTIVE:                   SUBJECTIVE STATEMENT TODAY:  Pt reports her L knee hurts less this past week abnd it did not pop. Pt paid attention to the twist with body mechanics.    CLBP  has not noticed any radiating pain over the past 4 weeks.  It only randomly occurs when she is more active , doing a lot PE with the kids.   Pt noticed the bubbles through the vagina this week with her period.                                                                                                                                                                   SUBJECTIVE STATEMENT ON EVAL 04/01/23 : 1) bubbles  through vagina: started in February 2024 and not now constant. It occurs after peeing  and have walked around , she feels little air bubbles leave. Pt sometimes she notices when she sits down, leaning back more or when popping leg under  her. Not as much when lying down but occasionally it does occurs.  Pt has not had this sensation before.  Pt notices when passing gas, "it rolls up" in the rectum and gas gets trapped.   Pt had  miscarriages in November 2023 and March 2024. Hx of fall onto the tailbone in the last 6 months from a hammock .   2) CLBP : starts after sitting > 4-6 hrs at work and standing > 1  hr. Pain occurs across LBP and hip bone, travels down R side front thigh to knee level. 3/10 , eases with position changes with lying on L side, propping pillow under back when on her back.  Completed PT for midback pain which improved by 60%.   Fitness: walking 20-30 min  3) constipation:  bowel movements occurs every 2 days lately with difficulty completing and have had to try to use her finger to get it out. Stool type 4 occurs 50% of the time and Type 1-2 the other 50%   4) incomplete urination: She feels dribble, she sits on the toilet for a while after peeing the first. She leans forward to completely empty the bladder. Pt notices her underwear is wet afterwards. Pt completed Pelvic PT it helped. Improvement 25-50% with Pelvic PT in the past.      PERTINENT HISTORY:  Pt is getting over a bout of shingles. Pt had 2 vaginal deliveries, episiotomy with 2-3rd perineal tear.   PAIN:  Are you having pain? Yes: see above  PRECAUTIONS: None  WEIGHT BEARING RESTRICTIONS: No  FALLS:  Has patient fallen in last 6 months? Yes, out of hammock   LIVING ENVIRONMENT: Lives with: family  Lives in: House/apartment Stairs: 4 STE no rail   OCCUPATION:  teacher   PLOF: Independent  PATIENT GOALS:  Help with air bubbles, dripping, LBP   OBJECTIVE:    [facility] PT Assessment - 05/27/23 0901       Coordination   Coordination and Movement Description tendency for forward head with neck ROM, cued for cervical retraction      Palpation   SI assessment  levelled pelvis./ R shoulder higher, dowagers hump present    Palpation  comment hypomobile C/T junction, tightness at R jaw, SCM, scalenes,      Bed Mobility   Bed Mobility --   lifting head with bed mobility ( cued for logrolling and stability to minimize strainin gpelvic floor)            [facility] Adult PT Treatment/Exercise - 05/27/23 0901       Neuro Re-ed    Neuro Re-ed Details  cued for neck stretches to minimize higher R shoulder and promote upright posture to help with deep core function, cued for cervical/ scapular retraction      Modalities   Modalities Moist Heat      Moist Heat Therapy   Number Minutes Moist Heat 5 Minutes    Moist Heat Location --   cervical ( during new HEP instruction)     Manual Therapy   Manual therapy comments distraction at occiput, STM/MWM at areas noted in assessment                 HOME EXERCISE PROGRAM: See pt instruction section    ASSESSMENT:  CLINICAL IMPRESSION:  Improvements:  Pt showed good carry over over from last session as pt reported no more popping of L knee.   CLBP continues to be resolved as pt has not noticed any radiating pain over the past month.   Addressed hypomobility at C/T junction, tightness of mm associated with R shoulder higher with manual Tx. Pt demo'd levelled shoulder height post Tx which will help pt be ready for deep core training and have less upper trap overuse and more optimal diaphragmatic excursion for optimal deep core function. Plan on deep core training next session.   Plan to address pelvic floor issues once pelvis and spine are realigned to yield better  outcomes.   Pt benefits from skilled PT.    OBJECTIVE IMPAIRMENTS decreased activity tolerance, decreased coordination, decreased endurance, decreased mobility, difficulty walking, decreased ROM, decreased strength, decreased safety awareness, hypomobility, increased muscle spasms, impaired flexibility, improper body mechanics, postural dysfunction, and pain. scar restrictions   ACTIVITY LIMITATIONS   self-care,  sleep, home chores, work tasks    PARTICIPATION LIMITATIONS:  community, gym activities    PERSONAL FACTORS        are also affecting patient's functional outcome.    REHAB POTENTIAL: Good   CLINICAL DECISION MAKING: Evolving/moderate complexity   EVALUATION COMPLEXITY: Moderate    PATIENT EDUCATION:    Education details: Showed pt anatomy images. Explained muscles attachments/ connection, physiology of deep core system/ spinal- thoracic-pelvis-lower kinetic chain as they relate to pt's presentation, Sx, and past Hx. Explained what and how these areas of deficits need to be restored to balance and function    See Therapeutic activity / neuromuscular re-education section  Answered pt's questions.   Person educated: Patient Education method: Explanation, Demonstration, Tactile cues, Verbal cues, and Handouts Education comprehension: verbalized understanding, returned demonstration, verbal cues required, tactile cues required, and needs further education     PLAN: PT FREQUENCY: 1x/week   PT DURATION: 10 weeks   PLANNED INTERVENTIONS: Therapeutic exercises, Therapeutic activity, Neuromuscular re-education, Balance training, Gait training, Patient/Family education, Self Care, Joint mobilization, Spinal mobilization, Moist heat, Taping, and Manual therapy, dry needling.   PLAN FOR NEXT SESSION: See clinical impression for plan     GOALS: Goals reviewed with patient? Yes  SHORT TERM GOALS: Target date: 04/29/2023    Pt will demo IND with HEP                    Baseline: Not IND            Goal status: INITIAL   LONG TERM GOALS: Target date: 06/10/2023    1.Pt will demo proper deep core coordination without chest breathing and optimal excursion of diaphragm/pelvic floor in order to promote spinal stability and pelvic floor function  Baseline: dyscoordination Goal status: INITIAL  2.  Pt will demo > 5 pt change on FOTO  to improve QOL and  function    Urinary Problem baseline-   52 Higher score =   better function    Bowel  constipation baseline - 60   Higher score = better function    Lumber baseline  - 69 Higher score = better function   Goal status: INITIAL  3.  Pt will demo proper body mechanics in against gravity tasks and ADLs  work tasks, fitness  to minimize straining pelvic floor / back    Baseline: not IND, improper form that places strain on pelvic floor  Goal status: INITIAL    4. Pt will demo increased gait speed > 1.3 m/s with reciprocal gait pattern, longer stride length  in order to ambulate safely in community and return to fitness routine  Baseline:  Goal status: INITIAL    5. Pt will report < 25%  of occurrence  air passage through vagina after peeing, sitting with proper sitting mechanics, and walking around across 1 week.   Baseline:  It occurs 100% of the time  after peeing  and have walked around , she feels little bubbles leave. Pt sometimes she notices when she sits down, leaning back more or when popping leg under her. Not as much when lying down but occasionally it does occurs.  Goal status:  INITIAL   6. Pt will report standing > 1 hr without pain, minor discomfort 1-2/10 and no radiating pain down R thigh in order to perform work and community activities  Baseline:  after sitting > 4-6 hrs at work and standing > 1 hr. Pain occurs across LBP and hip bone, travels down R side front thigh to knee level. 3/10 , eases with position changes with lying on L side, propping pillow under back when on her back.  Goal status: INITIAL  7.  Pt will report daily BMs and complete BMs without using finger  Baseline:  bowel movements occurs every 2 days lately with difficulty completing and have had to try to use her finger to get it out.  Goal status: INITIAL  8. Pt will report stool type 4 across> 75% of the time and no straining  Baseline:Stool type 4 occurs 50% of the time and Type 1-2 the  other 50%  Goals status:   INITIAL  9. Pt will demo levelled pelvic girdle and shoulder height in order to progress to deep core strengthening HEP and restore mobility at spine, pelvis, gait, posture minimize falls, and improve balance   Baseline:R shoulder. Iliac crest higher Goals status:   MET  Mariane Masters, PT 05/27/2023, 8:59 AM

## 2023-06-05 ENCOUNTER — Ambulatory Visit: Payer: Managed Care, Other (non HMO) | Admitting: Physical Therapy

## 2023-06-05 DIAGNOSIS — M5459 Other low back pain: Secondary | ICD-10-CM

## 2023-06-05 DIAGNOSIS — M533 Sacrococcygeal disorders, not elsewhere classified: Secondary | ICD-10-CM | POA: Diagnosis not present

## 2023-06-05 DIAGNOSIS — R2689 Other abnormalities of gait and mobility: Secondary | ICD-10-CM

## 2023-06-05 NOTE — Therapy (Signed)
OUTPATIENT PHYSICAL THERAPY Treatment    Patient Name: Jahnasia Ohrt MRN: [id number] DOB:[birth year], 37 y.o., female Today's Date: 06/05/2023   PT End of Session - 06/05/23 0936     Visit Number 4    Number of Visits 10    Date for PT Re-Evaluation 06/10/23    PT Start Time 0850    PT Stop Time 0936    PT Time Calculation (min) 46 min    Activity Tolerance Patient tolerated treatment well;No increased pain    Behavior During Therapy WFL for tasks assessed/performed             Past Medical History:  Diagnosis Date   Anemia    Herpes genitalis in women    Past Surgical History:  Procedure Laterality Date   NO PAST SURGERIES     Patient Active Problem List   Diagnosis Date Noted   BMI 50.0-59.9, adult (HCC) 03/12/2017    PCP:  Shelton Silvas   REFERRING PROVIDER: Tami Lin CNM  REFERRING DIAG: Continuous leakage, pelvic pain   Rationale for Evaluation and Treatment Habilitation  THERAPY DIAG:  Sacrococcygeal disorders, not elsewhere classified  Other low back pain  Other abnormalities of gait and mobility  ONSET DATE:   SUBJECTIVE:                   SUBJECTIVE STATEMENT TODAY:   Pt noticed the bubbles through the vagina when yelling. Pt has been doing her HEP                                                                                                                                                                   SUBJECTIVE STATEMENT ON EVAL 04/01/23 : 1) bubbles  through vagina: started in February 2024 and not now constant. It occurs after peeing  and have walked around , she feels little air bubbles leave. Pt sometimes she notices when she sits down, leaning back more or when popping leg under her. Not as much when lying down but occasionally it does occurs.  Pt has not had this sensation before.  Pt notices when passing gas, "it rolls up" in the rectum and gas gets trapped.   Pt had  miscarriages in November 2023 and March 2024. Hx of fall onto  the tailbone in the last 6 months from a hammock .   2) CLBP : starts after sitting > 4-6 hrs at work and standing > 1 hr. Pain occurs across LBP and hip bone, travels down R side front thigh to knee level. 3/10 , eases with position changes with lying on L side, propping pillow under back when on her back.  Completed PT for midback pain which improved by 60%.   Fitness: walking 20-30 min  3) constipation:  bowel movements occurs every 2 days lately with difficulty completing and have had to try to use her finger to get it out. Stool type 4 occurs 50% of the time and Type 1-2 the other 50%   4) incomplete urination: She feels dribble, she sits on the toilet for a while after peeing the first. She leans forward to completely empty the bladder. Pt notices her underwear is wet afterwards. Pt completed Pelvic PT it helped. Improvement 25-50% with Pelvic PT in the past.      PERTINENT HISTORY:  Pt is getting over a bout of shingles. Pt had 2 vaginal deliveries, episiotomy with 2-3rd perineal tear.   PAIN:  Are you having pain? Yes: see above  PRECAUTIONS: None  WEIGHT BEARING RESTRICTIONS: No  FALLS:  Has patient fallen in last 6 months? Yes, out of hammock   LIVING ENVIRONMENT: Lives with: family  Lives in: House/apartment Stairs: 4 STE no rail   OCCUPATION:  teacher   PLOF: Independent  PATIENT GOALS:  Help with air bubbles, dripping, LBP   OBJECTIVE:    [facility] PT Assessment - 06/05/23 1048       Palpation   Palpation comment II -III along C7, T1-2, T 7, cervical mm             [facility] Adult PT Treatment/Exercise - 06/05/23 1048       Bed Mobility   Bed Mobility --   lifting head with bed mobility ( cued for logrolling and stability to minimize strainin gpelvic floor)     Therapeutic Activites    Other Therapeutic Activities explained pressure system, anatomy of 3 diaphragms , explained why she noticed vaginal flatulence when yelling      Neuro Re-ed    Neuro Re-ed  Details  excessive cues for cervical / scapular retraction with band HEP      Exercises   Exercises Other Exercises    Other Exercises  see pt instructions      Modalities   Modalities Moist Heat      Moist Heat Therapy   Moist Heat Location --   thoracic and cervical ( during instruction of HEP)  10 min     Manual Therapy   Manual therapy comments PA mob Grade II -III along C7, T1-2, T 7, STM/MWM at problem areas noted in assessment               HOME EXERCISE PROGRAM: See pt instruction section    ASSESSMENT:  CLINICAL IMPRESSION:    Continued to addressed hypomobility at C/T junction with manual Tx. Advanced to cervicoscapular strengthening today with excessive cues for stability which will help pt be ready for deep core training and have less upper trap overuse and more optimal diaphragmatic excursion for optimal deep core function. Plan on deep core training next session.   Explained pressure system, anatomy of 3 diaphragms , explained why she noticed vaginal flatulence when yelling.  Plan to address pelvic floor issues once pelvis and spine are realigned to yield better outcomes.   Pt benefits from skilled PT.    OBJECTIVE IMPAIRMENTS decreased activity tolerance, decreased coordination, decreased endurance, decreased mobility, difficulty walking, decreased ROM, decreased strength, decreased safety awareness, hypomobility, increased muscle spasms, impaired flexibility, improper body mechanics, postural dysfunction, and pain. scar restrictions   ACTIVITY LIMITATIONS  self-care,  sleep, home chores, work tasks    PARTICIPATION LIMITATIONS:  community, gym activities    PERSONAL FACTORS        are also  affecting patient's functional outcome.    REHAB POTENTIAL: Good   CLINICAL DECISION MAKING: Evolving/moderate complexity   EVALUATION COMPLEXITY: Moderate    PATIENT EDUCATION:    Education details: Showed pt anatomy images. Explained muscles attachments/  connection, physiology of deep core system/ spinal- thoracic-pelvis-lower kinetic chain as they relate to pt's presentation, Sx, and past Hx. Explained what and how these areas of deficits need to be restored to balance and function    See Therapeutic activity / neuromuscular re-education section  Answered pt's questions.   Person educated: Patient Education method: Explanation, Demonstration, Tactile cues, Verbal cues, and Handouts Education comprehension: verbalized understanding, returned demonstration, verbal cues required, tactile cues required, and needs further education     PLAN: PT FREQUENCY: 1x/week   PT DURATION: 10 weeks   PLANNED INTERVENTIONS: Therapeutic exercises, Therapeutic activity, Neuromuscular re-education, Balance training, Gait training, Patient/Family education, Self Care, Joint mobilization, Spinal mobilization, Moist heat, Taping, and Manual therapy, dry needling.   PLAN FOR NEXT SESSION: See clinical impression for plan     GOALS: Goals reviewed with patient? Yes  SHORT TERM GOALS: Target date: 04/29/2023    Pt will demo IND with HEP                    Baseline: Not IND            Goal status: INITIAL   LONG TERM GOALS: Target date: 06/10/2023    1.Pt will demo proper deep core coordination without chest breathing and optimal excursion of diaphragm/pelvic floor in order to promote spinal stability and pelvic floor function  Baseline: dyscoordination Goal status: INITIAL  2.  Pt will demo > 5 pt change on FOTO  to improve QOL and function    Urinary Problem baseline-   52 Higher score =   better function    Bowel  constipation baseline - 60   Higher score = better function    Lumber baseline  - 69 Higher score = better function   Goal status: INITIAL  3.  Pt will demo proper body mechanics in against gravity tasks and ADLs  work tasks, fitness  to minimize straining pelvic floor / back    Baseline: not IND, improper form that  places strain on pelvic floor  Goal status: INITIAL    4. Pt will demo increased gait speed > 1.3 m/s with reciprocal gait pattern, longer stride length  in order to ambulate safely in community and return to fitness routine  Baseline:  Goal status: INITIAL    5. Pt will report < 25%  of occurrence  air passage through vagina after peeing, sitting with proper sitting mechanics, and walking around across 1 week.   Baseline:  It occurs 100% of the time  after peeing  and have walked around , she feels little bubbles leave. Pt sometimes she notices when she sits down, leaning back more or when popping leg under her. Not as much when lying down but occasionally it does occurs.  Goal status: INITIAL   6. Pt will report standing > 1 hr without pain, minor discomfort 1-2/10 and no radiating pain down R thigh in order to perform work and community activities  Baseline:  after sitting > 4-6 hrs at work and standing > 1 hr. Pain occurs across LBP and hip bone, travels down R side front thigh to knee level. 3/10 , eases with position changes with lying on L side, propping pillow under back when on her back.  Goal status: INITIAL  7.  Pt will report daily BMs and complete BMs without using finger  Baseline:  bowel movements occurs every 2 days lately with difficulty completing and have had to try to use her finger to get it out.  Goal status: INITIAL  8. Pt will report stool type 4 across> 75% of the time and no straining  Baseline:Stool type 4 occurs 50% of the time and Type 1-2 the other 50%  Goals status:   INITIAL  9. Pt will demo levelled pelvic girdle and shoulder height in order to progress to deep core strengthening HEP and restore mobility at spine, pelvis, gait, posture minimize falls, and improve balance   Baseline:R shoulder. Iliac crest higher Goals status:   MET  Mariane Masters, PT 06/05/2023, 10:52 AM

## 2023-06-05 NOTE — Patient Instructions (Signed)
Place band in "U"    band under ballmounds  while laying on back w/ knees bent   Lying on back, knees bent    band under ballmounds  while laying on back w/ knees bent  "W" exercise  10 reps x 2 sets   Band is placed under feet, knees bent, feet are hip width apart Hold band with thumbs point out, keep upper arm and elbow touching the bed the whole time  - inhale and then exhale pull bands by bending elbows hands move in a "w"  (feel shoulder blades squeezing)   ________________   Oblique/ scapula stabilization   Opposite arm   Place band in "U"    band under ballmounds  while laying on back w/ knees bent     20 reps  on each side  Holding band from opposite thigh,  Inhale,    exhale then pull band across body while keeping elbow , shoulders, back of the head pressed down    ______________

## 2023-06-06 ENCOUNTER — Encounter: Payer: Self-pay | Admitting: Internal Medicine

## 2023-06-18 ENCOUNTER — Ambulatory Visit: Payer: Managed Care, Other (non HMO) | Admitting: Physical Therapy

## 2023-06-18 DIAGNOSIS — M5459 Other low back pain: Secondary | ICD-10-CM | POA: Diagnosis present

## 2023-06-18 DIAGNOSIS — M533 Sacrococcygeal disorders, not elsewhere classified: Secondary | ICD-10-CM | POA: Insufficient documentation

## 2023-06-18 DIAGNOSIS — R2689 Other abnormalities of gait and mobility: Secondary | ICD-10-CM | POA: Diagnosis present

## 2023-06-18 NOTE — Therapy (Signed)
OUTPATIENT PHYSICAL THERAPY Treatment  /  Recert    Patient Name: Nancy Sloan MRN: [id number] DOB:07-11-1986, 37 y.o., female Today's Date: 06/18/2023   PT End of Session - 06/18/23 0858     Visit Number 5    Number of Visits 15    Date for PT Re-Evaluation 08/27/23    PT Start Time 0856    PT Stop Time 0935    PT Time Calculation (min) 39 min    Activity Tolerance Patient tolerated treatment well;No increased pain    Behavior During Therapy WFL for tasks assessed/performed             Past Medical History:  Diagnosis Date   Anemia    Herpes genitalis in women    Past Surgical History:  Procedure Laterality Date   NO PAST SURGERIES     Patient Active Problem List   Diagnosis Date Noted   BMI 50.0-59.9, adult (HCC) 03/12/2017    PCP:  Shelton Silvas   REFERRING PROVIDER: Tami Lin CNM  REFERRING DIAG: Continuous leakage, pelvic pain   Rationale for Evaluation and Treatment Habilitation  THERAPY DIAG:  Sacrococcygeal disorders, not elsewhere classified  Other low back pain  Other abnormalities of gait and mobility  ONSET DATE:   SUBJECTIVE:                   SUBJECTIVE STATEMENT TODAY:   Pt noticed LBP on R , does not travel down and pain at hip to thigh area/ groin[place]   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   Pt is have more bowel movements with soft and thinner stools                                                                                                                                                                 SUBJECTIVE STATEMENT ON EVAL 04/01/23 : 1) bubbles  through vagina: started in February 2024 and not now constant. It occurs after peeing  and have walked around , she feels little air bubbles leave. Pt sometimes she notices when she sits down, leaning back more or when popping leg under her. Not as much when lying down but occasionally it does occurs.  Pt has not had this sensation before.   Pt notices when passing gas, "it rolls up" in the rectum and gas gets trapped.   Pt had  miscarriages in November 2023 and March 2024. Hx of fall onto the tailbone in the last 6 months from a hammock .   2) CLBP : starts after sitting > 4-6 hrs at work and standing > 1 hr. Pain occurs across LBP and hip bone, travels down R side front thigh to knee level. 3/10 , eases  with position changes with lying on L side, propping pillow under back when on her back.  Completed PT for midback pain which improved by 60%.   Fitness: walking 20-30 min  3) constipation:  bowel movements occurs every 2 days lately with difficulty completing and have had to try to use her finger to get it out. Stool type 4 occurs 50% of the time and Type 1-2 the other 50%   4) incomplete urination: She feels dribble, she sits on the toilet for a while after peeing the first. She leans forward to completely empty the bladder. Pt notices her underwear is wet afterwards. Pt completed Pelvic PT it helped. Improvement 25-50% with Pelvic PT in the past.      PERTINENT HISTORY:  Pt is getting over a bout of shingles. Pt had 2 vaginal deliveries, episiotomy with 2-3rd perineal tear.   PAIN:  Are you having pain? Yes: see above  PRECAUTIONS: None  WEIGHT BEARING RESTRICTIONS: No  FALLS:  Has patient fallen in last 6 months? Yes, out of hammock   LIVING ENVIRONMENT: Lives with: family  Lives in: House/apartment Stairs: 4 STE no rail   OCCUPATION:  teacher   PLOF: Independent  PATIENT GOALS:  Help with air bubbles, dripping, LBP   OBJECTIVE:     [facility] PT Assessment - 06/18/23 0912       Other:   Other/ Comments simulated task at school, bending over children with locked knees/ spinal flexion.      AROM   Overall AROM Comments R sideflexion without pain      Palpation   SI assessment  R shoulder/ L iliac rest higher    Palpation comment hypomobile L SIJ      Ambulation/Gait   Gait Comments 1.44 m/s ,  reciprocal gait             [facility] Adult PT Treatment/Exercise - 06/18/23 0912       Neuro Re-ed    Neuro Re-ed Details  cued for proper bending, squat at work to minimize back pain/ straining pelvic floor, cued for L  SIJ clam shell strenghtening and stretches      Manual Therapy   Manual therapy comments rotational mob, grade III mob PA at L SIJ                HOME EXERCISE PROGRAM: See pt instruction section    ASSESSMENT:  CLINICAL IMPRESSION: Pt achieved 4/10 goals and progressing well towards remaining goals.   Improvements:   daily BMs and complete BMs without using finger  Improved stool consistency from Type 4 50% , Type 1-2 the other 50% to  stool type 4 and 5  100% of the time  standing > 1 hr without pain, minor discomfort 1-2/10 and no radiating pain down R thig  Pt demo less asymmetrical spinal and pelvic alignment and forward head. Today, required manual Tx to mobilize L SIJ to minimize R hip/groin area. Added hip strengthening to L hip abduction today. ANticipate today's Tx will help with LBP.   Plan on deep core training next session and progress with pelvic assessment.   Plan to address pelvic floor issues once pelvis and spine are realigned to yield better outcomes.   Pt benefits from skilled PT.    OBJECTIVE IMPAIRMENTS decreased activity tolerance, decreased coordination, decreased endurance, decreased mobility, difficulty walking, decreased ROM, decreased strength, decreased safety awareness, hypomobility, increased muscle spasms, impaired flexibility, improper body mechanics, postural dysfunction, and pain. scar restrictions  ACTIVITY LIMITATIONS  self-care,  sleep, home chores, work tasks    PARTICIPATION LIMITATIONS:  community, gym activities    PERSONAL FACTORS        are also affecting patient's functional outcome.    REHAB POTENTIAL: Good   CLINICAL DECISION MAKING: Evolving/moderate complexity   EVALUATION COMPLEXITY:  Moderate    PATIENT EDUCATION:    Education details: Showed pt anatomy images. Explained muscles attachments/ connection, physiology of deep core system/ spinal- thoracic-pelvis-lower kinetic chain as they relate to pt's presentation, Sx, and past Hx. Explained what and how these areas of deficits need to be restored to balance and function    See Therapeutic activity / neuromuscular re-education section  Answered pt's questions.   Person educated: Patient Education method: Explanation, Demonstration, Tactile cues, Verbal cues, and Handouts Education comprehension: verbalized understanding, returned demonstration, verbal cues required, tactile cues required, and needs further education     PLAN: PT FREQUENCY: 1x/week   PT DURATION: 10 weeks   PLANNED INTERVENTIONS: Therapeutic exercises, Therapeutic activity, Neuromuscular re-education, Balance training, Gait training, Patient/Family education, Self Care, Joint mobilization, Spinal mobilization, Moist heat, Taping, and Manual therapy, dry needling.   PLAN FOR NEXT SESSION: See clinical impression for plan     GOALS: Goals reviewed with patient? Yes  SHORT TERM GOALS: Target date: 04/29/2023    Pt will demo IND with HEP                    Baseline: Not IND            Goal status: MET    LONG TERM GOALS: Target date: 08/27/2023     1.Pt will demo proper deep core coordination without chest breathing and optimal excursion of diaphragm/pelvic floor in order to promote spinal stability and pelvic floor function  Baseline: dyscoordination Goal status: Ongoing   2.  Pt will demo > 5 pt change on FOTO  to improve QOL and function    Urinary Problem baseline-   52  --> 54 ( 06/18/23)  Higher score =   better function    Bowel  constipation baseline - 60  --> 68 ( 06/18/23)    Higher score = better function    Lumber baseline  - 69  -> 74 ( 06/18/23)  Higher score = better function   Goal status: on going   3.   Pt will demo proper body mechanics in against gravity tasks and ADLs  work tasks, fitness  to minimize straining pelvic floor / back    Baseline: not IND, improper form that places strain on pelvic floor  Goal status: ongoing    4. Pt will demo increased gait speed > 1.3 m/s with reciprocal gait pattern, longer stride length  in order to ambulate safely in community and return to fitness routine  Baseline: 1.13 m/s, decreased L stance, minimal trunk rotation  Goal status: MET ( 1.44 m/s , reciprocal gait )    5. Pt will report < 25%  of occurrence  air passage through vagina after peeing, sitting with proper sitting mechanics, and walking around across 1 week.   Baseline:  It occurs 100% of the time  after peeing  and have walked around , she feels little bubbles leave. Pt sometimes she notices when she sits down, leaning back more or when popping leg under her. Not as much when lying down but occasionally it does occurs.  Goal status:   Ongoing    6. Pt will report  standing > 1 hr without pain, minor discomfort 1-2/10 and no radiating pain down R thigh in order to perform work and community activities  Baseline:  after sitting > 4-6 hrs at work and standing > 1 hr. Pain occurs across LBP and hip bone, travels down R side front thigh to knee level. 3/10 , eases with position changes with lying on L side, propping pillow under back when on her back.  Goal status: MET   7.  Pt will report daily BMs and complete BMs without using finger  Baseline:  bowel movements occurs every 2 days lately with difficulty completing and have had to try to use her finger to get it out.  Goal status:  MET   8. Pt will report stool type 4 across> 75% of the time and no straining  Baseline:Stool type 4 occurs 50% of the time and Type 1-2 the other 50%  Goals status:   MET stool type 4 and 5    9. Pt will demo levelled pelvic girdle and shoulder height in order to progress to deep core strengthening HEP and  restore mobility at spine, pelvis, gait, posture minimize falls, and improve balance   Baseline:R shoulder. Iliac crest higher Goals status:   MET   10. Pt will report BMs with more control and less straining/ pushing / pinching off with the muscles  Baseline:  there is pinching and sparodic tightening of muscles  Goal Status: NEW    Mariane Masters, PT 06/18/2023, 9:00 AM

## 2023-06-18 NOTE — Patient Instructions (Signed)
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 Heel together, toes apart like ballerina,  Lift knee with exhale while pressing heels together. Be sure pelvis does not roll backward. Do not arch back. Do 20 times, R leg only this week  2 times per day.    Complimentary stretch:  Figure-4   Sit at 45 deg turn with R leg and knee on edge of chair/ bench, L buttock hanging off the edge to bring the L foot back like a lunge, toes bent, lower heel to feel quad stretch,  pay attention to keeping pinky and first toe ballmound planted to align toes forward so ankles are not twerked   Repeat with other side   ___

## 2023-06-19 ENCOUNTER — Encounter: Payer: Self-pay | Admitting: Internal Medicine

## 2023-06-26 ENCOUNTER — Ambulatory Visit: Payer: Managed Care, Other (non HMO) | Admitting: Anesthesiology

## 2023-06-26 ENCOUNTER — Encounter
Admission: RE | Disposition: A | Discharge status: Home / Self Care | Payer: Self-pay | Source: Home / Self Care | Attending: Internal Medicine

## 2023-06-26 ENCOUNTER — Ambulatory Visit
Admission: RE | Admit: 2023-06-26 | Discharge: 2023-06-26 | Disposition: A | Discharge status: Home / Self Care | Payer: Managed Care, Other (non HMO) | Attending: Internal Medicine | Admitting: Internal Medicine

## 2023-06-26 DIAGNOSIS — K640 First degree hemorrhoids: Secondary | ICD-10-CM | POA: Insufficient documentation

## 2023-06-26 DIAGNOSIS — K580 Irritable bowel syndrome with diarrhea: Secondary | ICD-10-CM | POA: Diagnosis not present

## 2023-06-26 DIAGNOSIS — R194 Change in bowel habit: Secondary | ICD-10-CM | POA: Diagnosis present

## 2023-06-26 HISTORY — PX: BIOPSY: SHX5522

## 2023-06-26 HISTORY — PX: COLONOSCOPY WITH PROPOFOL: SHX5780

## 2023-06-26 LAB — POCT PREGNANCY, URINE: Preg Test, Ur: NEGATIVE

## 2023-06-26 SURGERY — COLONOSCOPY WITH PROPOFOL
Anesthesia: General

## 2023-06-26 MED ORDER — LIDOCAINE HCL (PF) 2 % IJ SOLN
INTRAMUSCULAR | Status: DC | PRN
  Administered 2023-06-26: 40 mg via INTRADERMAL

## 2023-06-26 MED ORDER — SODIUM CHLORIDE 0.9 % IV SOLN: INTRAVENOUS | Status: DC

## 2023-06-26 MED ORDER — PROPOFOL 10 MG/ML IV BOLUS
INTRAVENOUS | Status: DC | PRN
  Administered 2023-06-26: 50 mg via INTRAVENOUS
  Administered 2023-06-26 (×3): 30 mg via INTRAVENOUS
  Administered 2023-06-26: 40 mg via INTRAVENOUS
  Administered 2023-06-26: 30 mg via INTRAVENOUS

## 2023-06-26 NOTE — H&P (Signed)
Outpatient short stay form Pre-procedure 06/26/2023 11:03 AM Nancy Sloan, M.D.  Primary Physician: Nancy Diver, PA-C  Reason for visit:  Change in bowel habits  History of present illness:  Nancy Sloan reports having some chronic GI issues have worsened over the past year since approximately November 2023. She has had increased sensation of "gas or movement in her stomach" it can occur in her upper or lower abdomen. She feels that her stools previously were a #3 on the Bristol stool scale. Now they can be a #4-6 and be a thinner consistency. Sometimes she feels she is not able to empty the stools well and has incomplete evacuation. She typically has a bowel movement daily or every other day. 2 times over the past year she has had a small amount of rectal burning discomfort and rectal bleeding. This has resolved and not persisted. She also notes a lot of increased gas.  She tried a probiotic which did not help the symptoms. She does note carbonated beverages including the carbonated probiotic drinks make symptoms worse. Usually drinks 1-2 sodas per day.  She has some reflux symptoms during the middle the night at times. She feels that somehow she will wake up with symptoms. She denies any nausea vomiting or dysphagia.     Current Facility-Administered Medications:    0.9 %  sodium chloride infusion, , Intravenous, Continuous, Nancy Sloan, Nancy Nearing, MD, Last Rate: 20 mL/hr at 06/26/23 1100, Continued from Pre-op at 06/26/23 1100  Medications Prior to Admission  Medication Sig Dispense Refill Last Dose   hyoscyamine (ANASPAZ) 0.125 MG TBDP disintergrating tablet Place 0.125 mg under the tongue every 6 (six) hours as needed for cramping.      [facility] EVENING PRIMROSE OIL PO Take by mouth.      FISH OIL-COENZYME Q10 PO Take 1 tablet by mouth daily.        No Known Allergies   Past Medical History:  Diagnosis Date   Anemia    Herpes genitalis in women     Review of systems:   Otherwise negative.    Physical Exam  Gen: Alert, oriented. Appears stated age.  HEENT: [place]/AT. PERRLA. Lungs: CTA, no wheezes. CV: RR nl S1, S2. Abd: soft, benign, no masses. BS+ Ext: No edema. Pulses 2+    Planned procedures: Proceed with colonoscopy. The patient understands the nature of the planned procedure, indications, risks, alternatives and potential complications including but not limited to bleeding, infection, perforation, damage to internal organs and possible oversedation/side effects from anesthesia. The patient agrees and gives consent to proceed.  Please refer to procedure notes for findings, recommendations and patient disposition/instructions.     Nancy Sloan Nancy Sloan, M.D. Gastroenterology 06/26/2023  11:03 AM

## 2023-06-26 NOTE — Op Note (Signed)
[facility] Patient Name: Nancy Sloan Procedure Date: 06/26/2023 11:13 AM MRN: [id number] Account #: [id number] Date of Birth: 29-Jul-1985 Admit Type: Outpatient Age: 37 Room: [facility] ENDO ROOM 2 Gender: Female Note Status: Finalized Instrument Name: Prentice Docker [id number] Procedure:             Colonoscopy Indications:           Change in bowel habits Providers:             Boykin Nearing. Selene Peltzer MD, MD Medicines:             Propofol per Anesthesia Complications:         No immediate complications. Estimated blood loss:                         Minimal. Procedure:             Pre-Anesthesia Assessment:                        - The risks and benefits of the procedure and the                         sedation options and risks were discussed with the                         patient. All questions were answered and informed                         consent was obtained.                        - Patient identification and proposed procedure were                         verified prior to the procedure by the nurse. The                         procedure was verified in the procedure room.                        - ASA Grade Assessment: III - A patient with severe                         systemic disease.                        - After reviewing the risks and benefits, the patient                         was deemed in satisfactory condition to undergo the                         procedure.                        After obtaining informed consent, the colonoscope was                         passed under direct vision. Throughout the procedure,  the patient's blood pressure, pulse, and oxygen                         saturations were monitored continuously. The                         Colonoscope was introduced through the anus and                         advanced to the the cecum, identified by appendiceal                         orifice  and ileocecal valve. The colonoscopy was                         performed without difficulty. The patient tolerated                         the procedure well. The quality of the bowel                         preparation was adequate. The ileocecal valve,                         appendiceal orifice, and rectum were photographed. Findings:      The perianal and digital rectal examinations were normal. Pertinent       negatives include normal sphincter tone and no palpable rectal lesions.      Non-bleeding internal hemorrhoids were found during retroflexion. The       hemorrhoids were Grade I (internal hemorrhoids that do not prolapse).      No other significant abnormalities were identified in a careful       examination of the remainder of the colon.      Biopsies for histology were taken with a cold forceps from the random       colon for evaluation of microscopic colitis. Impression:            - Non-bleeding internal hemorrhoids.                        - Biopsies were taken with a cold forceps from the                         random colon for evaluation of microscopic colitis. Recommendation:        - Patient has a contact number available for                         emergencies. The signs and symptoms of potential                         delayed complications were discussed with the patient.                         Return to normal activities tomorrow. Written                         discharge instructions were provided to the patient.                        -  Resume previous diet.                        - Continue present medications.                        - Await pathology results.                        - Repeat colonoscopy in 9 years for screening purposes.                        - Return to GI office PRN.                        - The findings and recommendations were discussed with                         the patient. Procedure Code(s):     --- Professional ---                         [phone], Colonoscopy, flexible; with biopsy, single or                         multiple Diagnosis Code(s):     --- Professional ---                        R19.4, Change in bowel habit                        K64.0, First degree hemorrhoids CPT copyright 2022 American [employer]. All rights reserved. The codes documented in this report are preliminary and upon coder review may  be revised to meet current compliance requirements. Stanton Kidney MD, MD 06/26/2023 11:35:38 AM This report has been signed electronically. Number of Addenda: 0 Note Initiated On: 06/26/2023 11:13 AM Scope Withdrawal Time: 0 hours 6 minutes 51 seconds  Total Procedure Duration: 0 hours 9 minutes 17 seconds  Estimated Blood Loss:  Estimated blood loss was minimal.      [facility]

## 2023-06-26 NOTE — Anesthesia Preprocedure Evaluation (Signed)
Anesthesia Evaluation  Patient identified by MRN, date of birth, ID band Patient awake    Reviewed: Allergy & Precautions, H&P , NPO status , Patient's Chart, lab work & pertinent test results, reviewed documented beta blocker date and time   Airway Mallampati: II   Neck ROM: full    Dental  (+) Poor Dentition   Pulmonary neg pulmonary ROS   Pulmonary exam normal        Cardiovascular Exercise Tolerance: Good negative cardio ROS Normal cardiovascular exam Rhythm:regular Rate:Normal     Neuro/Psych negative neurological ROS  negative psych ROS   GI/Hepatic negative GI ROS, Neg liver ROS,,,  Endo/Other  negative endocrine ROS    Renal/GU negative Renal ROS  negative genitourinary   Musculoskeletal   Abdominal   Peds  Hematology  (+) Blood dyscrasia, anemia   Anesthesia Other Findings Past Medical History: No date: Anemia No date: Herpes genitalis in women Past Surgical History: No date: NO PAST SURGERIES   Reproductive/Obstetrics negative OB ROS                             Anesthesia Physical Anesthesia Plan  ASA: 2  Anesthesia Plan: General   Post-op Pain Management:    Induction:   PONV Risk Score and Plan:   Airway Management Planned:   Additional Equipment:   Intra-op Plan:   Post-operative Plan:   Informed Consent: I have reviewed the patients History and Physical, chart, labs and discussed the procedure including the risks, benefits and alternatives for the proposed anesthesia with the patient or authorized representative who has indicated his/her understanding and acceptance.     Dental Advisory Given  Plan Discussed with: CRNA  Anesthesia Plan Comments:        Anesthesia Quick Evaluation

## 2023-06-26 NOTE — Interval H&P Note (Signed)
History and Physical Interval Note:  06/26/2023 11:06 AM  Nancy Sloan  has presented today for surgery, with the diagnosis of R19.4 - Change in bowel habits K21.9 - Gastroesophageal reflux disease, unspecified whether esophagitis present K58.2 - Irritable bowel syndrome with both constipation and diarrhea.  The various methods of treatment have been discussed with the patient and family. After consideration of risks, benefits and other options for treatment, the patient has consented to  Procedure(s): COLONOSCOPY WITH PROPOFOL (N/A) as a surgical intervention.  The patient's history has been reviewed, patient examined, no change in status, stable for surgery.  I have reviewed the patient's chart and labs.  Questions were answered to the patient's satisfaction.     [place], Caro

## 2023-06-26 NOTE — Transfer of Care (Signed)
Immediate Anesthesia Transfer of Care Note  Patient: Nancy Sloan  Procedure(s) Performed: COLONOSCOPY WITH PROPOFOL BIOPSY  Patient Location: PACU and Endoscopy Unit  Anesthesia Type:MAC  Level of Consciousness: drowsy  Airway & Oxygen Therapy: Patient Spontanous Breathing and Patient connected to nasal cannula oxygen  Post-op Assessment: Report given to RN and Post -op Vital signs reviewed and stable  Post vital signs: Reviewed and stable  Last Vitals:  Vitals Value Taken Time  BP 117/63 06/26/23 1135  Temp    Pulse 94 06/26/23 1135  Resp 12 06/26/23 1135  SpO2 100 % 06/26/23 1135  Vitals shown include unfiled device data.  Last Pain:  Vitals:   06/26/23 1034  TempSrc: Temporal  PainSc: 0-No pain         Complications: No notable events documented.

## 2023-06-27 ENCOUNTER — Encounter: Payer: Self-pay | Admitting: Internal Medicine

## 2023-06-27 LAB — SURGICAL PATHOLOGY

## 2023-07-01 IMAGING — US US PELVIS COMPLETE WITH TRANSVAGINAL
1 series · 15 of 25 positions shown · non-contrast
Comparison: None

CLINICAL DATA: Abdomen cramping

EXAM:
TRANSABDOMINAL AND TRANSVAGINAL ULTRASOUND OF PELVIS
TECHNIQUE: Both transabdominal and transvaginal ultrasound examinations of the
pelvis were performed. Transabdominal technique was performed for
global imaging of the pelvis including uterus, ovaries, adnexal
regions, and pelvic cul-de-sac. It was necessary to proceed with
endovaginal exam following the transabdominal exam to visualize the
uterus endometrium ovaries.

[Series 1: us pelvis complete · 15 of 60 slices shown]
[im 1/60]
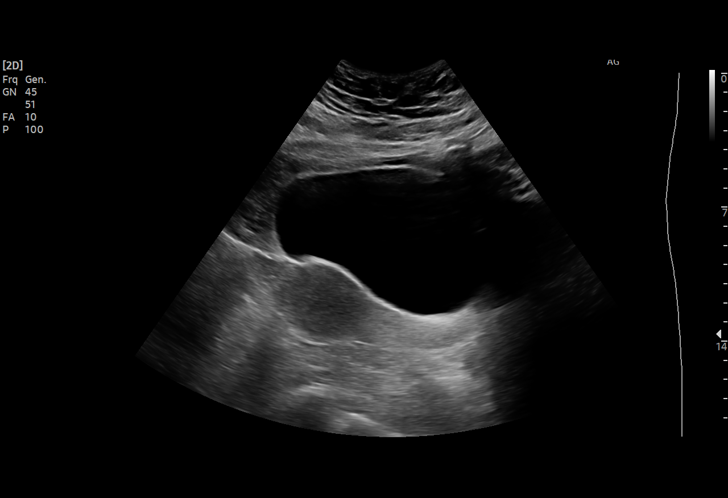
[im 5/60]
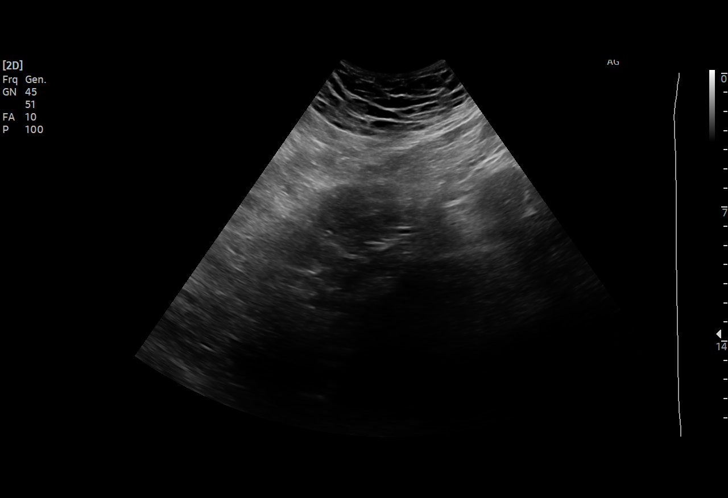
[im 10/60]
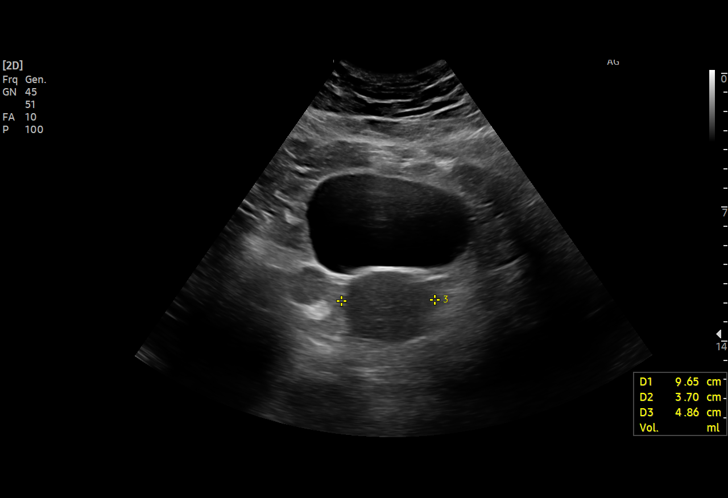
[im 13/60]
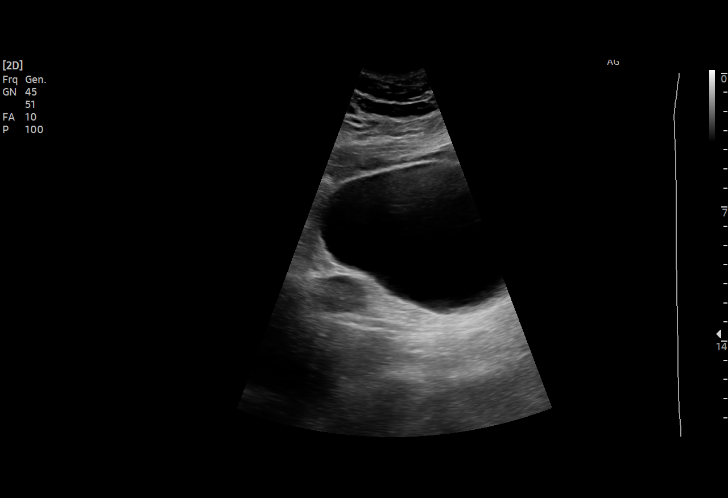
[im 18/60]
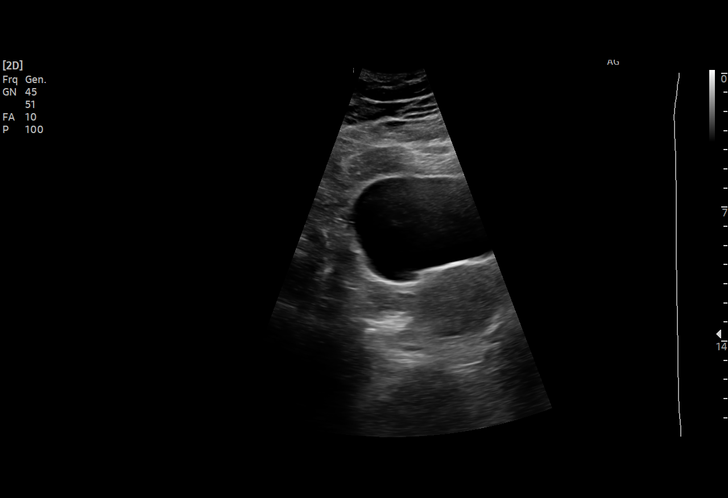
[im 23/60]
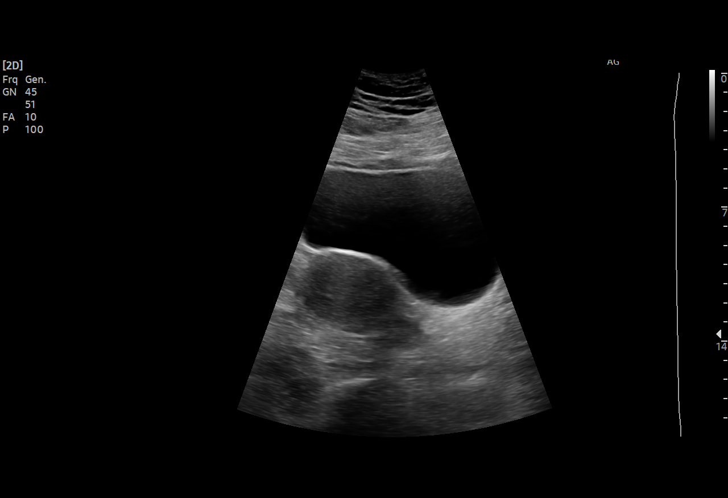
[im 25/60]
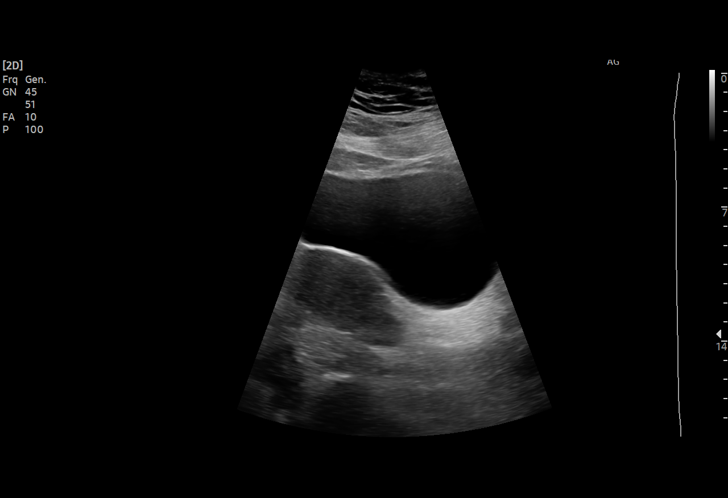
[im 30/60]
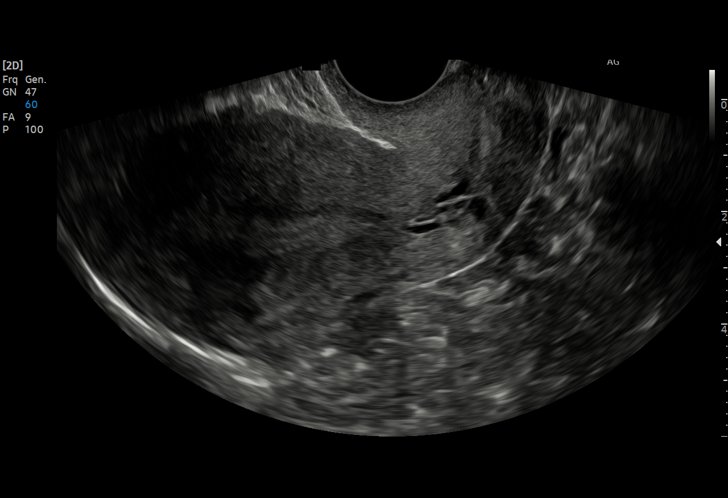
[im 35/60]
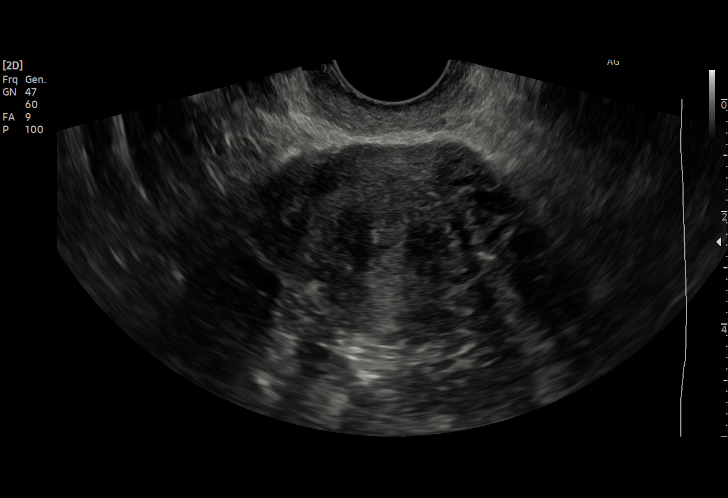
[im 37/60]
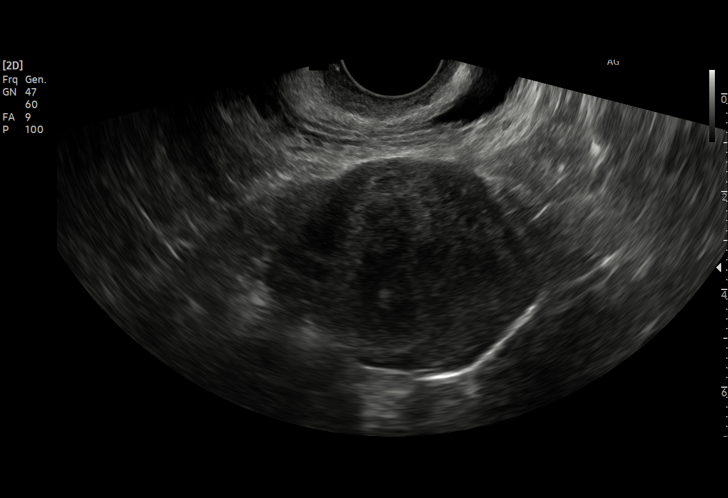
[im 42/60]
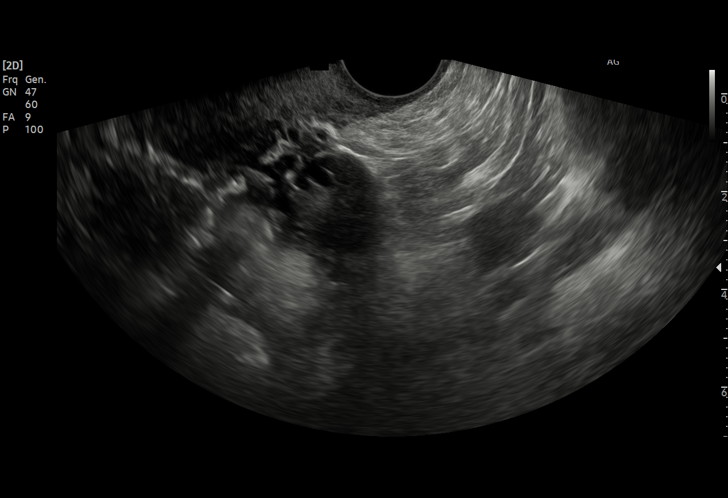
[im 47/60]
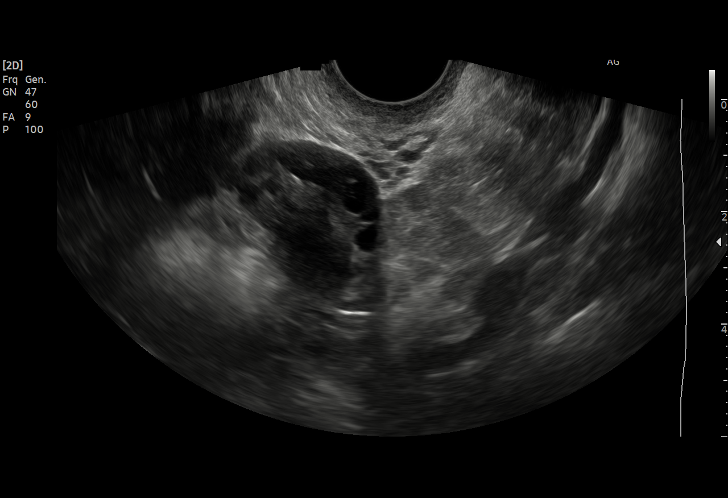
[im 50/60]
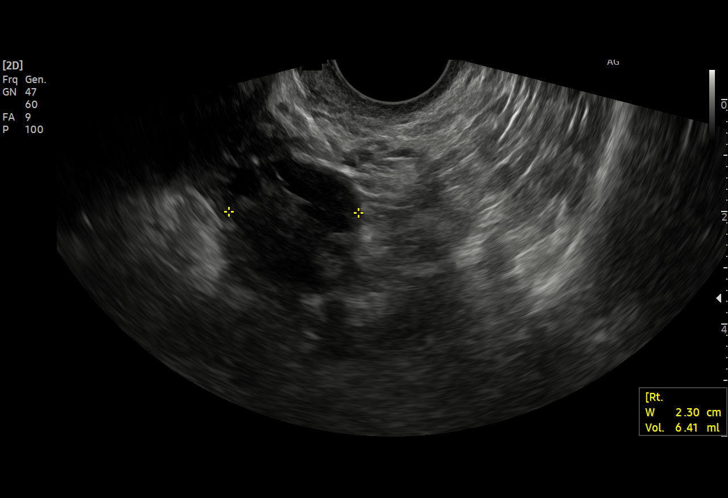
[im 55/60]
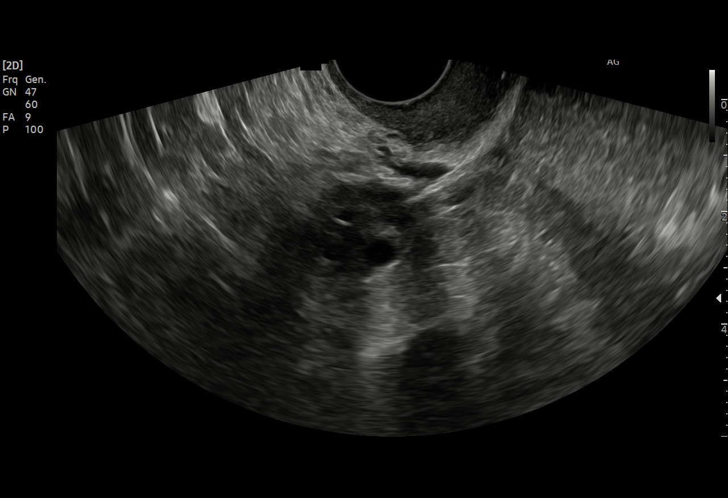
[im 60/60]
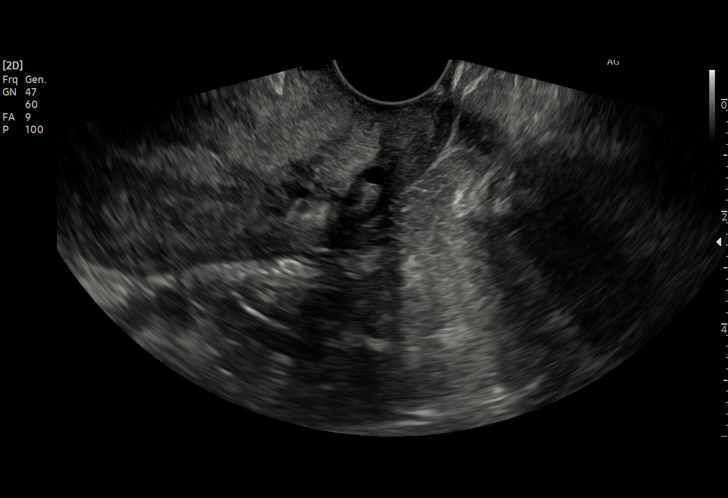

[15 of 25 positions shown; findings below may reference images not displayed]

FINDINGS: Uterus

Measurements: 8.2 x 4.1 x 5.1 cm = volume: 90 mL. No fibroids or
other mass visualized.

Endometrium

Thickness: 8 mm.  No focal abnormality visualized.

Right ovary

Measurements: 2.7 x 2 x 2.3 cm = volume: 6 mL. Normal appearance/no
adnexal mass.

Left ovary

Measurements: 2.6 x 1.9 x 1.9 cm = volume: 5 mL. Normal
appearance/no adnexal mass.

Other findings

No abnormal free fluid.
IMPRESSION: Negative pelvic ultrasound

## 2023-07-02 ENCOUNTER — Ambulatory Visit: Payer: Managed Care, Other (non HMO) | Admitting: Physical Therapy

## 2023-07-16 NOTE — Anesthesia Postprocedure Evaluation (Signed)
 Anesthesia Post Note  Patient: Nancy Sloan  Procedure(s) Performed: COLONOSCOPY WITH PROPOFOL  BIOPSY  Patient location during evaluation: PACU Anesthesia Type: General Level of consciousness: awake and alert Pain management: pain level controlled Vital Signs Assessment: post-procedure vital signs reviewed and stable Respiratory status: spontaneous breathing, nonlabored ventilation, respiratory function stable and patient connected to nasal cannula oxygen Cardiovascular status: blood pressure returned to baseline and stable Postop Assessment: no apparent nausea or vomiting Anesthetic complications: no   No notable events documented.   Last Vitals:  Vitals:   06/26/23 1145 06/26/23 1155  BP: 104/78 105/73  Pulse: 93 84  Resp: 15 18  Temp:    SpO2: 100% 100%    Last Pain:  Vitals:   06/26/23 1155  TempSrc:   PainSc: 0-No pain                 Lynwood KANDICE Clause

## 2023-07-24 ENCOUNTER — Ambulatory Visit: Payer: Managed Care, Other (non HMO) | Admitting: Physical Therapy

## 2023-07-31 ENCOUNTER — Ambulatory Visit: Payer: Managed Care, Other (non HMO) | Admitting: Physical Therapy

## 2023-07-31 DIAGNOSIS — R2689 Other abnormalities of gait and mobility: Secondary | ICD-10-CM | POA: Diagnosis present

## 2023-07-31 DIAGNOSIS — M5459 Other low back pain: Secondary | ICD-10-CM | POA: Insufficient documentation

## 2023-07-31 DIAGNOSIS — M533 Sacrococcygeal disorders, not elsewhere classified: Secondary | ICD-10-CM | POA: Diagnosis present

## 2023-07-31 NOTE — Patient Instructions (Signed)
 Seated:  Red band with thumbs out, elbow by side, inhale, exhale, pull apart with elbow by ribs  20 reps chin tuck  Redband behind head, shoulders down, chin tuck, squeeze shoulders together down and back  20 reps   __   Deep core level 1-2 ( handout) but perform in seated position ( or semi reclined position)  Twice a day

## 2023-07-31 NOTE — Therapy (Signed)
 OUTPATIENT PHYSICAL THERAPY Treatment     Patient Name: Nancy Sloan MRN: [id number] DOB:1986-01-20, 37 y.o., female Today's Date: 07/31/2023   PT End of Session - 07/31/23 0600     Visit Number 6    Number of Visits 15    Date for PT Re-Evaluation 08/27/23    PT Start Time 0855    PT Stop Time 0935    PT Time Calculation (min) 40 min    Activity Tolerance Patient tolerated treatment well;No increased pain    Behavior During Therapy [facility] for tasks assessed/performed             Past Medical History:  Diagnosis Date   Anemia    Herpes genitalis in women    Past Surgical History:  Procedure Laterality Date   BIOPSY  06/26/2023   Procedure: BIOPSY;  Surgeon: Corky Diener, Alphonsus Jeans, MD;  Location: [facility] ENDOSCOPY;  Service: Gastroenterology;;   COLONOSCOPY WITH PROPOFOL  N/A 06/26/2023   Procedure: COLONOSCOPY WITH PROPOFOL ;  Surgeon: Toledo, Alphonsus Jeans, MD;  Location: [facility] ENDOSCOPY;  Service: Gastroenterology;  Laterality: N/A;   NO PAST SURGERIES     Patient Active Problem List   Diagnosis Date Noted   BMI 50.0-59.9, adult (HCC) 03/12/2017    PCP:  Johnette Naegeli   REFERRING PROVIDER: Elvis Hamming CNM  REFERRING DIAG: Continuous leakage, pelvic pain   Rationale for Evaluation and Treatment Habilitation  THERAPY DIAG:  Sacrococcygeal disorders, not elsewhere classified  Other low back pain  Other abnormalities of gait and mobility  ONSET DATE:   SUBJECTIVE:                   SUBJECTIVE STATEMENT TODAY:  Pt is around [redacted] weeks pregnant and throwing up. Pt is leaking while throwing up.  Pt had some low back pain the last couple of days localized at sacrum, 3/10 pain  Pt was able to to sit on bleachers and able to handle it while watching dtr's game.                                                                                                                                                                  SUBJECTIVE STATEMENT ON EVAL 04/01/23 : 1) bubbles  through  vagina: started in February 2024 and not now constant. It occurs after peeing  and have walked around , she feels little air bubbles leave. Pt sometimes she notices when she sits down, leaning back more or when popping leg under her. Not as much when lying down but occasionally it does occurs.  Pt has not had this sensation before.  Pt notices when passing gas, "it rolls up" in the rectum and gas gets trapped.   Pt had  miscarriages in November 2023 and March 2024. Hx of fall  onto the tailbone in the last 6 months from a hammock .   2) CLBP : starts after sitting > 4-6 hrs at work and standing > 1 hr. Pain occurs across LBP and hip bone, travels down R side front thigh to knee level. 3/10 , eases with position changes with lying on L side, propping pillow under back when on her back.  Completed PT for midback pain which improved by 60%.   Fitness: walking 20-30 min  3) constipation:  bowel movements occurs every 2 days lately with difficulty completing and have had to try to use her finger to get it out. Stool type 4 occurs 50% of the time and Type 1-2 the other 50%   4) incomplete urination: She feels dribble, she sits on the toilet for a while after peeing the first. She leans forward to completely empty the bladder. Pt notices her underwear is wet afterwards. Pt completed Pelvic PT it helped. Improvement 25-50% with Pelvic PT in the past.      PERTINENT HISTORY:  Pt is getting over a bout of shingles. Pt had 2 vaginal deliveries, episiotomy with 2-3rd perineal tear.   PAIN:  Are you having pain? Yes: see above  PRECAUTIONS: None  WEIGHT BEARING RESTRICTIONS: No  FALLS:  Has patient fallen in last 6 months? Yes, out of hammock   LIVING ENVIRONMENT: Lives with: family  Lives in: House/apartment Stairs: 4 STE no rail   OCCUPATION:  teacher   PLOF: Independent  PATIENT GOALS:  Help with air bubbles, dripping, LBP   OBJECTIVE:     [facility] PT Assessment - 07/31/23 1312        Observation/Other Assessments   Observations more upright posture , less dowagers hump, no cues for proper sitting      Coordination   Coordination and Movement Description overuse of ab mm w/ deep core,  poor cervical propioception with cervical retraction      Palpation   SI assessment  levelled shoulder and pelvis             [facility] Adult PT Treatment/Exercise - 07/31/23 1313       Therapeutic Activites    Other Therapeutic Activities modified strengthening HEP to seated and dwongraded resistance band as pt found out she is [redacted] week pregnant      Neuro Re-ed    Neuro Re-ed Details  excessive cues for seated deep core level 1-2, cued for cervical/ scapular  retraction with resistance band red in seated position              HOME EXERCISE PROGRAM: See pt instruction section    ASSESSMENT:  CLINICAL IMPRESSION:  Updates:   Pt is around [redacted] weeks pregnant and throwing up. Pt is leaking while throwing up.  Pt had some low back pain the last couple of days localized at sacrum, 3/10 pain    Improvements:             Pt was able to to sit on bleachers and able to handle it while watching dtr's game.            Pt demo'd more upright posture , less dowagers hump, no cues for proper sitting    Today pt progressed to   deep core training which was modified in seated position because pt has been experiencing nausea/ morning sickness.  Also modified past strengthening HEP to seated position and down graded band from green to red. Pt demo'd new modifications with cues with  proper technique.  Provided excessive cues for seated deep core level 1-2, cued for cervical/ scapular  retraction with resistance band red in seated position  Discussed progress with pelvic assessment externally at next session to address incomplete emptying issues.   Pt benefits from skilled PT.    OBJECTIVE IMPAIRMENTS decreased activity tolerance, decreased coordination, decreased endurance, decreased  mobility, difficulty walking, decreased ROM, decreased strength, decreased safety awareness, hypomobility, increased muscle spasms, impaired flexibility, improper body mechanics, postural dysfunction, and pain. scar restrictions   ACTIVITY LIMITATIONS  self-care,  sleep, home chores, work tasks    PARTICIPATION LIMITATIONS:  community, gym activities    PERSONAL FACTORS        are also affecting patient's functional outcome.    REHAB POTENTIAL: Good   CLINICAL DECISION MAKING: Evolving/moderate complexity   EVALUATION COMPLEXITY: Moderate    PATIENT EDUCATION:    Education details: Showed pt anatomy images. Explained muscles attachments/ connection, physiology of deep core system/ spinal- thoracic-pelvis-lower kinetic chain as they relate to pt's presentation, Sx, and past Hx. Explained what and how these areas of deficits need to be restored to balance and function    See Therapeutic activity / neuromuscular re-education section  Answered pt's questions.   Person educated: Patient Education method: Explanation, Demonstration, Tactile cues, Verbal cues, and Handouts Education comprehension: verbalized understanding, returned demonstration, verbal cues required, tactile cues required, and needs further education     PLAN: PT FREQUENCY: 1x/week   PT DURATION: 10 weeks   PLANNED INTERVENTIONS: Therapeutic exercises, Therapeutic activity, Neuromuscular re-education, Balance training, Gait training, Patient/Family education, Self Care, Joint mobilization, Spinal mobilization, Moist heat, Taping, and Manual therapy, dry needling.   PLAN FOR NEXT SESSION: See clinical impression for plan     GOALS: Goals reviewed with patient? Yes  SHORT TERM GOALS: Target date: 04/29/2023    Pt will demo IND with HEP                    Baseline: Not IND            Goal status: MET    LONG TERM GOALS: Target date: 08/27/2023     1.Pt will demo proper deep core coordination without  chest breathing and optimal excursion of diaphragm/pelvic floor in order to promote spinal stability and pelvic floor function  Baseline: dyscoordination Goal status: Ongoing   2.  Pt will demo > 5 pt change on FOTO  to improve QOL and function    Urinary Problem baseline-   52  --> 54 ( 06/18/23)  Higher score =   better function    Bowel  constipation baseline - 60  --> 68 ( 06/18/23)    Higher score = better function    Lumber baseline  - 69  -> 74 ( 06/18/23)  Higher score = better function   Goal status: on going   3.  Pt will demo proper body mechanics in against gravity tasks and ADLs  work tasks, fitness  to minimize straining pelvic floor / back    Baseline: not IND, improper form that places strain on pelvic floor  Goal status: ongoing    4. Pt will demo increased gait speed > 1.3 m/s with reciprocal gait pattern, longer stride length  in order to ambulate safely in community and return to fitness routine  Baseline: 1.13 m/s, decreased L stance, minimal trunk rotation  Goal status: MET ( 1.44 m/s , reciprocal gait )    5. Pt will report <  25%  of occurrence  air passage through vagina after peeing, sitting with proper sitting mechanics, and walking around across 1 week.   Baseline:  It occurs 100% of the time  after peeing  and have walked around , she feels little bubbles leave. Pt sometimes she notices when she sits down, leaning back more or when popping leg under her. Not as much when lying down but occasionally it does occurs.  Goal status:   Ongoing    6. Pt will report standing > 1 hr without pain, minor discomfort 1-2/10 and no radiating pain down R thigh in order to perform work and community activities  Baseline:  after sitting > 4-6 hrs at work and standing > 1 hr. Pain occurs across LBP and hip bone, travels down R side front thigh to knee level. 3/10 , eases with position changes with lying on L side, propping pillow under back when on her back.  Goal  status: MET   7.  Pt will report daily BMs and complete BMs without using finger  Baseline:  bowel movements occurs every 2 days lately with difficulty completing and have had to try to use her finger to get it out.  Goal status:  MET   8. Pt will report stool type 4 across> 75% of the time and no straining  Baseline:Stool type 4 occurs 50% of the time and Type 1-2 the other 50%  Goals status:   MET stool type 4 and 5    9. Pt will demo levelled pelvic girdle and shoulder height in order to progress to deep core strengthening HEP and restore mobility at spine, pelvis, gait, posture minimize falls, and improve balance   Baseline:R shoulder. Iliac crest higher Goals status:   MET   10. Pt will report BMs with more control and less straining/ pushing / pinching off with the muscles  Baseline:  there is pinching and sparodic tightening of muscles  Goal Status: NEW    [place] Andreas, PT 07/31/2023, 8:58 AM

## 2023-08-07 ENCOUNTER — Ambulatory Visit: Payer: Managed Care, Other (non HMO) | Admitting: Physical Therapy

## 2023-08-08 DIAGNOSIS — O09521 Supervision of elderly multigravida, first trimester: Secondary | ICD-10-CM | POA: Insufficient documentation

## 2023-08-14 ENCOUNTER — Ambulatory Visit: Payer: Managed Care, Other (non HMO) | Admitting: Physical Therapy

## 2023-08-14 DIAGNOSIS — M5459 Other low back pain: Secondary | ICD-10-CM

## 2023-08-14 DIAGNOSIS — R2689 Other abnormalities of gait and mobility: Secondary | ICD-10-CM

## 2023-08-14 DIAGNOSIS — M533 Sacrococcygeal disorders, not elsewhere classified: Secondary | ICD-10-CM | POA: Diagnosis not present

## 2023-08-14 NOTE — Therapy (Signed)
OUTPATIENT PHYSICAL THERAPY Treatment     Patient Name: Nancy Sloan MRN: [id number] DOB:1986/03/11, 38 y.o., female Today's Date: 08/14/2023   PT End of Session - 08/14/23 0928     Visit Number 7    Number of Visits 15    Date for PT Re-Evaluation 08/27/23    PT Start Time 0850    PT Stop Time 0928    PT Time Calculation (min) 38 min    Activity Tolerance Patient tolerated treatment well;No increased pain    Behavior During Therapy [facility] for tasks assessed/performed             Past Medical History:  Diagnosis Date   Anemia    Herpes genitalis in women    Past Surgical History:  Procedure Laterality Date   BIOPSY  06/26/2023   Procedure: BIOPSY;  Surgeon: Norma Fredrickson, Boykin Nearing, MD;  Location: [facility] ENDOSCOPY;  Service: Gastroenterology;;   COLONOSCOPY WITH PROPOFOL N/A 06/26/2023   Procedure: COLONOSCOPY WITH PROPOFOL;  Surgeon: Toledo, Boykin Nearing, MD;  Location: [facility] ENDOSCOPY;  Service: Gastroenterology;  Laterality: N/A;   NO PAST SURGERIES     Patient Active Problem List   Diagnosis Date Noted   BMI 50.0-59.9, adult (HCC) 03/12/2017    PCP:  Shelton Silvas   REFERRING PROVIDER: Tami Lin CNM  REFERRING DIAG: Continuous leakage, pelvic pain   Rationale for Evaluation and Treatment Habilitation  THERAPY DIAG:  Sacrococcygeal disorders, not elsewhere classified  Other low back pain  Other abnormalities of gait and mobility  ONSET DATE:   SUBJECTIVE:                   SUBJECTIVE STATEMENT TODAY:  Pt is around [redacted] weeks pregnant and throwing up. Pt is leaking while throwing up.  Pt had some low back pain the last couple of days localized at sacrum, 3/10 pain  Pt was able to to sit on bleachers and able to handle it while watching dtr's game.                                                                                                                                                                  SUBJECTIVE STATEMENT ON EVAL 04/01/23 : 1) bubbles  through  vagina: started in February 2024 and not now constant. It occurs after peeing  and have walked around , she feels little air bubbles leave. Pt sometimes she notices when she sits down, leaning back more or when popping leg under her. Not as much when lying down but occasionally it does occurs.  Pt has not had this sensation before.  Pt notices when passing gas, "it rolls up" in the rectum and gas gets trapped.   Pt had  miscarriages in November 2023 and March 2024. Hx of fall  onto the tailbone in the last 6 months from a hammock .   2) CLBP : starts after sitting > 4-6 hrs at work and standing > 1 hr. Pain occurs across LBP and hip bone, travels down R side front thigh to knee level. 3/10 , eases with position changes with lying on L side, propping pillow under back when on her back.  Completed PT for midback pain which improved by 60%.   Fitness: walking 20-30 min  3) constipation:  bowel movements occurs every 2 days lately with difficulty completing and have had to try to use her finger to get it out. Stool type 4 occurs 50% of the time and Type 1-2 the other 50%   4) incomplete urination: She feels dribble, she sits on the toilet for a while after peeing the first. She leans forward to completely empty the bladder. Pt notices her underwear is wet afterwards. Pt completed Pelvic PT it helped. Improvement 25-50% with Pelvic PT in the past.      PERTINENT HISTORY:  Pt is getting over a bout of shingles. Pt had 2 vaginal deliveries, episiotomy with 2-3rd perineal tear.   PAIN:  Are you having pain? Yes: see above  PRECAUTIONS: None  WEIGHT BEARING RESTRICTIONS: No  FALLS:  Has patient fallen in last 6 months? Yes, out of hammock   LIVING ENVIRONMENT: Lives with: family  Lives in: House/apartment Stairs: 4 STE no rail   OCCUPATION:  teacher   PLOF: Independent  PATIENT GOALS:  Help with air bubbles, dripping, LBP   OBJECTIVE:   Pelvic Floor Special Questions - 08/14/23 1239      Pelvic Floor Internal Exam through clothing in hooklying:  tightness along anterior mm R > L , adductors R             [facility] Adult PT Treatment/Exercise - 08/14/23 1241       Therapeutic Activites    Other Therapeutic Activities explained two type of kegels training      Neuro Re-ed    Neuro Re-ed Details  cued for quick contractions kegel contractions              HOME EXERCISE PROGRAM: See pt instruction section    ASSESSMENT:  CLINICAL IMPRESSION:  Updates:   Pt is around [redacted] weeks pregnant and throwing up. Pt is leaking while throwing up.   Improvements:             Pt was able to to sit on bleachers and able to handle it while watching dtr's game.            Pt demo'd more upright posture , less dowagers hump, no cues for proper sitting    Today pt progressed to quick kegel contractions toady after external manual Tx was applied to minimize anterior mm tightness R >L.  Pt demo'd improved sequential activation with deep core system post Tx. Plan to advnace to endurance contractions next session and progress to seated quick contractions.   Advised pt to withhold with resistance band HEP until she enters 2nd trimester.   Pt benefits from skilled PT.    OBJECTIVE IMPAIRMENTS decreased activity tolerance, decreased coordination, decreased endurance, decreased mobility, difficulty walking, decreased ROM, decreased strength, decreased safety awareness, hypomobility, increased muscle spasms, impaired flexibility, improper body mechanics, postural dysfunction, and pain. scar restrictions   ACTIVITY LIMITATIONS  self-care,  sleep, home chores, work tasks    PARTICIPATION LIMITATIONS:  community, gym activities    PERSONAL FACTORS  are also affecting patient's functional outcome.    REHAB POTENTIAL: Good   CLINICAL DECISION MAKING: Evolving/moderate complexity   EVALUATION COMPLEXITY: Moderate    PATIENT EDUCATION:    Education details: Showed pt  anatomy images. Explained muscles attachments/ connection, physiology of deep core system/ spinal- thoracic-pelvis-lower kinetic chain as they relate to pt's presentation, Sx, and past Hx. Explained what and how these areas of deficits need to be restored to balance and function    See Therapeutic activity / neuromuscular re-education section  Answered pt's questions.   Person educated: Patient Education method: Explanation, Demonstration, Tactile cues, Verbal cues, and Handouts Education comprehension: verbalized understanding, returned demonstration, verbal cues required, tactile cues required, and needs further education     PLAN: PT FREQUENCY: 1x/week   PT DURATION: 10 weeks   PLANNED INTERVENTIONS: Therapeutic exercises, Therapeutic activity, Neuromuscular re-education, Balance training, Gait training, Patient/Family education, Self Care, Joint mobilization, Spinal mobilization, Moist heat, Taping, and Manual therapy, dry needling.   PLAN FOR NEXT SESSION: See clinical impression for plan     GOALS: Goals reviewed with patient? Yes  SHORT TERM GOALS: Target date: 04/29/2023    Pt will demo IND with HEP                    Baseline: Not IND            Goal status: MET    LONG TERM GOALS: Target date: 08/27/2023     1.Pt will demo proper deep core coordination without chest breathing and optimal excursion of diaphragm/pelvic floor in order to promote spinal stability and pelvic floor function  Baseline: dyscoordination Goal status: Ongoing   2.  Pt will demo > 5 pt change on FOTO  to improve QOL and function    Urinary Problem baseline-   52  --> 54 ( 06/18/23)  Higher score =   better function    Bowel  constipation baseline - 60  --> 68 ( 06/18/23)    Higher score = better function    Lumber baseline  - 69  -> 74 ( 06/18/23)  Higher score = better function   Goal status: on going   3.  Pt will demo proper body mechanics in against gravity tasks and ADLs   work tasks, fitness  to minimize straining pelvic floor / back    Baseline: not IND, improper form that places strain on pelvic floor  Goal status: ongoing    4. Pt will demo increased gait speed > 1.3 m/s with reciprocal gait pattern, longer stride length  in order to ambulate safely in community and return to fitness routine  Baseline: 1.13 m/s, decreased L stance, minimal trunk rotation  Goal status: MET ( 1.44 m/s , reciprocal gait )    5. Pt will report < 25%  of occurrence  air passage through vagina after peeing, sitting with proper sitting mechanics, and walking around across 1 week.   Baseline:  It occurs 100% of the time  after peeing  and have walked around , she feels little bubbles leave. Pt sometimes she notices when she sits down, leaning back more or when popping leg under her. Not as much when lying down but occasionally it does occurs.  Goal status:   Ongoing    6. Pt will report standing > 1 hr without pain, minor discomfort 1-2/10 and no radiating pain down R thigh in order to perform work and community activities  Baseline:  after sitting > 4-6  hrs at work and standing > 1 hr. Pain occurs across LBP and hip bone, travels down R side front thigh to knee level. 3/10 , eases with position changes with lying on L side, propping pillow under back when on her back.  Goal status: MET   7.  Pt will report daily BMs and complete BMs without using finger  Baseline:  bowel movements occurs every 2 days lately with difficulty completing and have had to try to use her finger to get it out.  Goal status:  MET   8. Pt will report stool type 4 across> 75% of the time and no straining  Baseline:Stool type 4 occurs 50% of the time and Type 1-2 the other 50%  Goals status:   MET stool type 4 and 5    9. Pt will demo levelled pelvic girdle and shoulder height in order to progress to deep core strengthening HEP and restore mobility at spine, pelvis, gait, posture minimize falls, and  improve balance   Baseline:R shoulder. Iliac crest higher Goals status:   MET   10. Pt will report BMs with more control and less straining/ pushing / pinching off with the muscles  Baseline:  there is pinching and sparodic tightening of muscles  Goal Status: NEW    Mariane Masters, PT 08/14/2023, 12:33 PM

## 2023-08-14 NOTE — Patient Instructions (Signed)
SIJ belt wearing with long walks and grocery shopping  __  [facility] PROGRAM  Exercise check off  All the exercises on your back in the morning and night    Mon Tue Wed Thurs Fri  Sat Sun   Before deep core level 1:  _1_ sec hold,  3 __ reps                          Before deep core level 2  _1_ sec hold,  3 __ reps              After  deep core level 2 _1_ sec hold,  3 __ reps            TOTAL of 6 sets of the quick kegels  if you the above routine in the morning and night

## 2023-08-21 ENCOUNTER — Ambulatory Visit: Payer: Managed Care, Other (non HMO) | Admitting: Physical Therapy

## 2023-08-28 ENCOUNTER — Encounter: Payer: Managed Care, Other (non HMO) | Admitting: Physical Therapy

## 2023-09-01 ENCOUNTER — Other Ambulatory Visit: Payer: Self-pay

## 2023-09-01 ENCOUNTER — Emergency Department: Payer: Managed Care, Other (non HMO)

## 2023-09-01 ENCOUNTER — Emergency Department
Admission: EM | Admit: 2023-09-01 | Discharge: 2023-09-01 | Disposition: A | Discharge status: Home / Self Care | Payer: Managed Care, Other (non HMO) | Attending: Emergency Medicine | Admitting: Emergency Medicine

## 2023-09-01 ENCOUNTER — Encounter: Payer: Self-pay | Admitting: Emergency Medicine

## 2023-09-01 DIAGNOSIS — O26891 Other specified pregnancy related conditions, first trimester: Secondary | ICD-10-CM | POA: Insufficient documentation

## 2023-09-01 DIAGNOSIS — J101 Influenza due to other identified influenza virus with other respiratory manifestations: Secondary | ICD-10-CM | POA: Insufficient documentation

## 2023-09-01 DIAGNOSIS — R102 Pelvic and perineal pain: Secondary | ICD-10-CM | POA: Diagnosis not present

## 2023-09-01 DIAGNOSIS — Z3A13 13 weeks gestation of pregnancy: Secondary | ICD-10-CM | POA: Diagnosis not present

## 2023-09-01 DIAGNOSIS — O98511 Other viral diseases complicating pregnancy, first trimester: Secondary | ICD-10-CM | POA: Insufficient documentation

## 2023-09-01 LAB — RESP PANEL BY RT-PCR (RSV, FLU A&B, COVID)  RVPGX2
Influenza A by PCR: POSITIVE — AB
Influenza B by PCR: NEGATIVE
Resp Syncytial Virus by PCR: NEGATIVE
SARS Coronavirus 2 by RT PCR: NEGATIVE

## 2023-09-01 LAB — GROUP A STREP BY PCR: Group A Strep by PCR: NOT DETECTED

## 2023-09-01 LAB — HCG, QUANTITATIVE, PREGNANCY: hCG, Beta Chain, Quant, S: 73625 m[IU]/mL — ABNORMAL HIGH (ref <5)

## 2023-09-01 MED ORDER — SODIUM CHLORIDE 0.9 % IV BOLUS
1000.0000 mL | Freq: Once | INTRAVENOUS | Status: AC
  Administered 2023-09-01: 1000 mL via INTRAVENOUS

## 2023-09-01 MED ORDER — ACETAMINOPHEN 500 MG PO TABS
1000.0000 mg | ORAL_TABLET | Freq: Once | ORAL | Status: AC
  Administered 2023-09-01: 1000 mg via ORAL
  Filled 2023-09-01: qty 2

## 2023-09-01 NOTE — ED Triage Notes (Signed)
 Patient to ED via POV for sore throat, fever, cough. States mostly upper respiratory symptoms. Started yesterday, currently [redacted] weeks pregnant.

## 2023-09-01 NOTE — ED Provider Notes (Signed)
 [facility] Provider Note    Event Date/Time   First MD Initiated Contact with Patient 09/01/23 0800     (approximate)   History   Sore Throat   HPI  Nancy Sloan is a 38 y.o. female [redacted] weeks pregnant with history of anemia, miscarriage and as listed in EMR presents to the emergency department for evaluation of sore throat, fever, cough, rhinorrhea. She has had some vomiting, but no more than usual during pregnancy. Last vomited yesterday. No diarrhea or abdominal pain. She denies vaginal bleeding or discharge.      Physical Exam   Triage Vital Signs: ED Triage Vitals [09/01/23 0723]  Encounter Vitals Group     BP 117/79     Systolic BP Percentile      Diastolic BP Percentile      Pulse Rate (!) 126     Resp 18     Temp 99.6 F (37.6 C)     Temp Source Oral     SpO2 98 %     Weight (!) 330 lb (149.7 kg)     Height 5\' 4"  (1.626 m)     Head Circumference      Peak Flow      Pain Score 3     Pain Loc      Pain Education      Exclude from Growth Chart     Most recent vital signs: Vitals:   09/01/23 0723 09/01/23 1124  BP: 117/79 120/80  Pulse: (!) 126 (!) 110  Resp: 18 18  Temp: 99.6 F (37.6 C)   SpO2: 98% 98%    General: Awake, no distress.  CV:  Good peripheral perfusion.  Resp:  Normal effort. Breath sounds clear. Abd:  No distention. Soft, nontender Other:  Oropharynx erythamtous.   ED Results / Procedures / Treatments   Labs (all labs ordered are listed, but only abnormal results are displayed) Labs Reviewed  RESP PANEL BY RT-PCR (RSV, FLU A&B, COVID)  [id number] - Abnormal; Notable for the following components:      Result Value   Influenza A by PCR POSITIVE (*)    All other components within normal limits  HCG, QUANTITATIVE, PREGNANCY - Abnormal; Notable for the following components:   hCG, Beta Chain, Quant, S [id number] (*)    All other components within normal limits  GROUP A STREP BY PCR     EKG  Not  indicated.   RADIOLOGY  Image and radiology report reviewed and interpreted by me. Radiology report consistent with the same.  Ultrasound OB complete less than 14 weeks shows a single IUP measuring 13 weeks and 5 days with a fetal heart rate of 150.  PROCEDURES:  Critical Care performed: No  Procedures   MEDICATIONS ORDERED IN ED:  Medications  acetaminophen (TYLENOL) tablet 1,000 mg (1,000 mg Oral Given 09/01/23 0843)  sodium chloride 0.9 % bolus 1,000 mL (0 mLs Intravenous Stopped 09/01/23 1013)     IMPRESSION / MDM / ASSESSMENT AND PLAN / ED COURSE   I have reviewed the triage note.  Differential diagnosis includes, but is not limited to, COVID, influenza, RSV, gastroenteritis  Patient's presentation is most consistent with acute illness / injury with system symptoms.  38 year old female presenting to the emergency department for treatment and evaluation of viral symptoms as described in the HPI.  Vital signs reviewed.  She has a low-grade temperature and is mildly tachycardic at 126.  She is [redacted] weeks pregnant.  IV  fluids ordered.  Respiratory panel is pending.  She denies pregnancy related complaints.  ----------------------------------------- 9:43 AM on 09/01/2023 ----------------------------------------- Heart rate down to 109.  Fluids nearly complete.  Plan had been to discharge patient's after fluids were completed however patient now dates that she is having some abdominal cramping and feels that she needs an ultrasound to make sure that the baby is okay.  She denies having vaginal discharge or bleeding.  Plan will now be to check a beta-hCG and get an ultrasound.  ----------------------------------------- 11:05 AM on 09/01/2023 ----------------------------------------- Ultrasound is reassuring.  She is measuring 13 weeks 3 days, fetal heart rate 150.  Results discussed with the patient.  Tachycardia has improved after fluids.  Plan will be to discharge her home  with encouragement to continue taking Tylenol and increasing fluid intake.  She was advised to remain out of work until she has been fever free for 24 hours.   FINAL CLINICAL IMPRESSION(S) / ED DIAGNOSES   Final diagnoses:  Influenza A  Pelvic pain affecting pregnancy in second trimester, antepartum     Rx / DC Orders   ED Discharge Orders     None        Note:  This document was prepared using Dragon voice recognition software and may include unintentional dictation errors.   Chinita Pester, FNP 09/01/23 1253    Dionne Bucy, MD 09/01/23 1525

## 2023-09-01 NOTE — Discharge Instructions (Signed)
 Continue to take Tylenol for sore throat, fever, or bodyaches.  Increase fluid intake and rest.  You may return to work once fever free for 24 hours.

## 2023-09-04 DIAGNOSIS — O09899 Supervision of other high risk pregnancies, unspecified trimester: Secondary | ICD-10-CM | POA: Insufficient documentation

## 2023-09-11 ENCOUNTER — Other Ambulatory Visit: Payer: Self-pay

## 2023-09-11 DIAGNOSIS — O0991 Supervision of high risk pregnancy, unspecified, first trimester: Secondary | ICD-10-CM

## 2023-09-20 DIAGNOSIS — R7309 Other abnormal glucose: Secondary | ICD-10-CM | POA: Insufficient documentation

## 2023-10-02 ENCOUNTER — Encounter
Admission: RE | Admit: 2023-10-02 | Discharge: 2023-10-02 | Disposition: A | Discharge status: Home / Self Care | Source: Ambulatory Visit | Attending: *Deleted | Admitting: *Deleted

## 2023-10-02 NOTE — Consult Note (Signed)
 [facility] Anesthesia Consultation  Nancy Sloan OZD:[id number] DOB: Jun 30, 1986 DOA: 10/02/2023 PCP: Cyndia Diver, PA-C   Requesting physician: Dr. Feliberto Gottron Date of consultation: 10/02/23 Reason for consultation: Obesity during pregnancy  CHIEF COMPLAINT:  Obesity during pregnancy  HISTORY OF PRESENT ILLNESS: Nancy Sloan  is a 38 y.o. female with a known history of class 5 obesity who is presenting for airway evaluation at 17.[redacted] weeks gestation.  PAST MEDICAL HISTORY:   Past Medical History:  Diagnosis Date   Anemia    Herpes genitalis in women     PAST SURGICAL HISTORY:  Past Surgical History:  Procedure Laterality Date   BIOPSY  06/26/2023   Procedure: BIOPSY;  Surgeon: Norma Fredrickson, Boykin Nearing, MD;  Location: [facility] ENDOSCOPY;  Service: Gastroenterology;;   COLONOSCOPY WITH PROPOFOL N/A 06/26/2023   Procedure: COLONOSCOPY WITH PROPOFOL;  Surgeon: Toledo, Boykin Nearing, MD;  Location: [facility] ENDOSCOPY;  Service: Gastroenterology;  Laterality: N/A;   NO PAST SURGERIES      SOCIAL HISTORY:  Social History   Tobacco Use   Smoking status: Never    Passive exposure: Past   Smokeless tobacco: Never  Substance Use Topics   Alcohol use: Not Currently    FAMILY HISTORY:  Family History  Problem Relation Age of Onset   Thyroid disease Mother    Cancer - Cervical Mother    Heart disease Mother    Endometriosis Sister    Breast cancer Neg Hx    Ovarian cancer Neg Hx    Colon cancer Neg Hx    Diabetes Neg Hx     DRUG ALLERGIES: No Known Allergies  REVIEW OF SYSTEMS:   RESPIRATORY: No cough, shortness of breath, wheezing.  CARDIOVASCULAR: No chest pain, orthopnea, edema.  HEMATOLOGY: No anemia, easy bruising or bleeding SKIN: No rash or lesion. NEUROLOGIC: No tingling, numbness, weakness.  PSYCHIATRY: No anxiety or depression.   MEDICATIONS AT HOME:  Prior to Admission medications   Medication Sig Start Date End Date  Taking? Authorizing Provider  [facility] EVENING PRIMROSE OIL PO Take by mouth.    [provider]  FISH OIL-COENZYME Q10 PO Take 1 tablet by mouth daily.    [provider]  hyoscyamine (ANASPAZ) 0.125 MG TBDP disintergrating tablet Place 0.125 mg under the tongue every 6 (six) hours as needed for cramping.    [provider]      PHYSICAL EXAMINATION:   VITAL SIGNS: There were no vitals taken for this visit.  GENERAL:  38 y.o.-year-old patient no acute distress.  HEENT: Head atraumatic, normocephalic. Oropharynx and nasopharynx clear. MP 1, TM distance >3 cm, normal mouth opening. LUNGS: No use of accessory muscles of respiration.   EXTREMITIES: No pedal edema, cyanosis, or clubbing.  NEUROLOGIC: normal gait PSYCHIATRIC: The patient is alert and oriented x 3.  SKIN: No obvious rash, lesion, or ulcer.    IMPRESSION AND PLAN:   Nancy Sloan  is a 38 y.o. female presenting with obesity during pregnancy. BMI is currently 57.7 at 17.[redacted] weeks gestation.   We discussed analgesic options during labor including epidural analgesia. Discussed that in obesity there can be increased difficulty with epidural placement or even failure of successful epidural. We also discussed that even after successful epidural placement there is increased risk of catheter migration out of the epidural space that would require catheter replacement. Discussed use of epidural vs spinal vs GA if cesarean delivery is required. Discussed increased risk of difficult intubation during pregnancy should an emergency cesarean delivery be required.  We discussed repeat evaluation at 35-36 weeks by anesthesia to determine whether there is a high risk of complications of anesthesia for which we would recommend transfer of OB care to a facility with a higher maternal level of care designation.

## 2023-10-09 ENCOUNTER — Ambulatory Visit: Payer: Managed Care, Other (non HMO) | Admitting: Physical Therapy

## 2023-10-09 DIAGNOSIS — M533 Sacrococcygeal disorders, not elsewhere classified: Secondary | ICD-10-CM | POA: Diagnosis present

## 2023-10-09 DIAGNOSIS — M5459 Other low back pain: Secondary | ICD-10-CM | POA: Diagnosis present

## 2023-10-09 DIAGNOSIS — R2689 Other abnormalities of gait and mobility: Secondary | ICD-10-CM | POA: Diagnosis present

## 2023-10-09 NOTE — Therapy (Unsigned)
 OUTPATIENT PHYSICAL THERAPY Treatment  / Discharge Summary across 8 visits / Recert    Patient Name: Nancy Sloan MRN: [id number] DOB:Jun 07, 1986, 38 y.o., female Today's Date: 10/09/2023   PT End of Session - 10/09/23 1339     Visit Number 8    Number of Visits 15    Date for PT Re-Evaluation 10/10/23    PT Start Time 1335    PT Stop Time 1415    PT Time Calculation (min) 40 min    Activity Tolerance Patient tolerated treatment well;No increased pain    Behavior During Therapy [facility] for tasks assessed/performed             Past Medical History:  Diagnosis Date   Anemia    Herpes genitalis in women    Past Surgical History:  Procedure Laterality Date   BIOPSY  06/26/2023   Procedure: BIOPSY;  Surgeon: Norma Fredrickson, Boykin Nearing, MD;  Location: [facility] ENDOSCOPY;  Service: Gastroenterology;;   COLONOSCOPY WITH PROPOFOL N/A 06/26/2023   Procedure: COLONOSCOPY WITH PROPOFOL;  Surgeon: Toledo, Boykin Nearing, MD;  Location: [facility] ENDOSCOPY;  Service: Gastroenterology;  Laterality: N/A;   NO PAST SURGERIES     Patient Active Problem List   Diagnosis Date Noted   BMI 50.0-59.9, adult (HCC) 03/12/2017    PCP:  Shelton Silvas   REFERRING PROVIDER: Tami Lin CNM  REFERRING DIAG: Continuous leakage, pelvic pain   Rationale for Evaluation and Treatment Habilitation  THERAPY DIAG:  Sacrococcygeal disorders, not elsewhere classified  Other low back pain  Other abnormalities of gait and mobility  ONSET DATE:   SUBJECTIVE:                   SUBJECTIVE STATEMENT TODAY:  Pt is [redacted]  weeks pregnant. Pt is only feeling some rib cage pain and R groin. Pt plans to get a SIJ belt.    Vaginal flatulence occurs only  20% of the time after peeing and 0% with walking instead of 100% of the time.                                                                                                                                                                 SUBJECTIVE STATEMENT ON EVAL 04/01/23 : 1)  bubbles  through vagina: started in February 2024 and not now constant. It occurs after peeing  and have walked around , she feels little air bubbles leave. Pt sometimes she notices when she sits down, leaning back more or when popping leg under her. Not as much when lying down but occasionally it does occurs.  Pt has not had this sensation before.  Pt notices when passing gas, "it rolls up" in the rectum and gas gets trapped.   Pt had  miscarriages in November 2023 and  March 2024. Hx of fall onto the tailbone in the last 6 months from a hammock .   2) CLBP : starts after sitting > 4-6 hrs at work and standing > 1 hr. Pain occurs across LBP and hip bone, travels down R side front thigh to knee level. 3/10 , eases with position changes with lying on L side, propping pillow under back when on her back.  Completed PT for midback pain which improved by 60%.   Fitness: walking 20-30 min  3) constipation:  bowel movements occurs every 2 days lately with difficulty completing and have had to try to use her finger to get it out. Stool type 4 occurs 50% of the time and Type 1-2 the other 50%   4) incomplete urination: She feels dribble, she sits on the toilet for a while after peeing the first. She leans forward to completely empty the bladder. Pt notices her underwear is wet afterwards. Pt completed Pelvic PT it helped. Improvement 25-50% with Pelvic PT in the past.      PERTINENT HISTORY:  Pt is getting over a bout of shingles. Pt had 2 vaginal deliveries, episiotomy with 2-3rd perineal tear.   PAIN:  Are you having pain? Yes: see above  PRECAUTIONS: None  WEIGHT BEARING RESTRICTIONS: No  FALLS:  Has patient fallen in last 6 months? Yes, out of hammock   LIVING ENVIRONMENT: Lives with: family  Lives in: House/apartment Stairs: 4 STE no rail   OCCUPATION:  teacher   PLOF: Independent  PATIENT GOALS:  Help with air bubbles, dripping, LBP   OBJECTIVE:        HOME EXERCISE  PROGRAM: See pt instruction section    ASSESSMENT:  CLINICAL IMPRESSION:  80% less vaginal flatulence with sit to stand after peeing and no more with walking  daily BMs and complete BMs without using finger  improved softer stool  stool type 4 across> 75% of the time and no straining  BMs with more control and less straining/ pushing / pinching off with the muscle  Resolved radiating LBP and able to standing > 1 hr without pain, minor discomfort 1-2/10 and no radiating pain down R thigh in order to perform work and community activities  Gait speed increased 1.13 m/s, decreased L stance, minimal trunk rotation  to reciporcal gait pattern 1.44 m/s      OBJECTIVE IMPAIRMENTS decreased activity tolerance, decreased coordination, decreased endurance, decreased mobility, difficulty walking, decreased ROM, decreased strength, decreased safety awareness, hypomobility, increased muscle spasms, impaired flexibility, improper body mechanics, postural dysfunction, and pain. scar restrictions   ACTIVITY LIMITATIONS  self-care,  sleep, home chores, work tasks    PARTICIPATION LIMITATIONS:  community, gym activities    PERSONAL FACTORS        are also affecting patient's functional outcome.    REHAB POTENTIAL: Good   CLINICAL DECISION MAKING: Evolving/moderate complexity   EVALUATION COMPLEXITY: Moderate    PATIENT EDUCATION:    Education details: Showed pt anatomy images. Explained muscles attachments/ connection, physiology of deep core system/ spinal- thoracic-pelvis-lower kinetic chain as they relate to pt's presentation, Sx, and past Hx. Explained what and how these areas of deficits need to be restored to balance and function    See Therapeutic activity / neuromuscular re-education section  Answered pt's questions.   Person educated: Patient Education method: Explanation, Demonstration, Tactile cues, Verbal cues, and Handouts Education comprehension: verbalized understanding,  returned demonstration, verbal cues required, tactile cues required, and needs further education  PLAN: PT FREQUENCY: 1x/week   PT DURATION: 10 weeks   PLANNED INTERVENTIONS: Therapeutic exercises, Therapeutic activity, Neuromuscular re-education, Balance training, Gait training, Patient/Family education, Self Care, Joint mobilization, Spinal mobilization, Moist heat, Taping, and Manual therapy, dry needling.   PLAN FOR NEXT SESSION: See clinical impression for plan     GOALS: Goals reviewed with patient? Yes  SHORT TERM GOALS: Target date: 04/29/2023    Pt will demo IND with HEP                    Baseline: Not IND            Goal status: MET    LONG TERM GOALS: Target date: 08/27/2023     1.Pt will demo proper deep core coordination without chest breathing and optimal excursion of diaphragm/pelvic floor in order to promote spinal stability and pelvic floor function  Baseline: dyscoordination Goal status:  MET  2.  Pt will demo > 5 pt change on FOTO  to improve QOL and function    Urinary Problem baseline-   52  --> 54 ( 06/18/23)  Higher score =   better function    Bowel  constipation baseline - 60  --> 68 ( 06/18/23)    Higher score = better function    Lumber baseline  - 69  -> 74 ( 06/18/23)  Higher score = better function   Goal status: DEFERRED   3.  Pt will demo proper body mechanics in against gravity tasks and ADLs  work tasks, fitness  to minimize straining pelvic floor / back    Baseline: not IND, improper form that places strain on pelvic floor  Goal status: MET    4. Pt will demo increased gait speed > 1.3 m/s with reciprocal gait pattern, longer stride length  in order to ambulate safely in community and return to fitness routine  Baseline: 1.13 m/s, decreased L stance, minimal trunk rotation  Goal status: MET ( 1.44 m/s , reciprocal gait )    5. Pt will report < 25%  of occurrence  air passage through vagina after peeing, sitting with  proper sitting mechanics, and walking around across 1 week.   Baseline:  It occurs 100% of the time  after peeing  and have walked around , she feels little bubbles leave. Pt sometimes she notices when she sits down, leaning back more or when popping leg under her. Not as much when lying down but occasionally it does occurs.  Goal status:  MET  ( 10/09/23: 20% of the time after peeing and 0% with walking )   6. Pt will report standing > 1 hr without pain, minor discomfort 1-2/10 and no radiating pain down R thigh in order to perform work and community activities  Baseline:  after sitting > 4-6 hrs at work and standing > 1 hr. Pain occurs across LBP and hip bone, travels down R side front thigh to knee level. 3/10 , eases with position changes with lying on L side, propping pillow under back when on her back.  Goal status: MET   7.  Pt will report daily BMs and complete BMs without using finger  Baseline:  bowel movements occurs every 2 days lately with difficulty completing and have had to try to use her finger to get it out.  Goal status:  MET   8. Pt will report stool type 4 across> 75% of the time and no straining  Baseline:Stool type 4 occurs 50%  of the time and Type 1-2 the other 50%  Goals status:   MET stool type 4 and 5    9. Pt will demo levelled pelvic girdle and shoulder height in order to progress to deep core strengthening HEP and restore mobility at spine, pelvis, gait, posture minimize falls, and improve balance   Baseline:R shoulder. Iliac crest higher Goals status:   MET   10. Pt will report BMs with more control and less straining/ pushing / pinching off with the muscles  Baseline:  there is pinching and sparodic tightening of muscles  Goal Status: MET    Mariane Masters, PT 10/09/2023, 1:40 PM

## 2023-10-09 NOTE — Patient Instructions (Signed)
Lengthen Back rib by L  shoulder ( winging)    Lie on R  side , pillow between knees and under head  Pull  L arm overhead over mattress, grab the edge of mattress,pull it upward, drawing elbow away from ears  Breathing 10 reps   Brushing arm with 3/4 turn onto pillow behind back  Lying on R  side ,Pillow/ Block between knees      dragging top forearm across ribs below breast rotating 3/4 turn,  rotating  _L_ only this week ,  relax onto the pillow behind the back  and then back to other palm , maintain top palm on body whole top and not lift shoulder   Do this side this week       Wait do both sides until we have levelled out your spine and shoulders

## 2023-10-14 ENCOUNTER — Ambulatory Visit

## 2023-10-14 DIAGNOSIS — Z3A19 19 weeks gestation of pregnancy: Secondary | ICD-10-CM

## 2023-10-14 DIAGNOSIS — B009 Herpesviral infection, unspecified: Secondary | ICD-10-CM | POA: Insufficient documentation

## 2023-10-14 NOTE — Progress Notes (Signed)
 New OB Intake  I connected with Nancy Sloan  on 10/14/23 at 3:00 PM by phone and verified that I am speaking with the correct person using two identifiers. Nurse is located at [facility] and pt is in her vehicle.   I explained I am completing New Patient Intake today. We discussed EDD of 03/06/2024, by Ultrasound. Pt is [id number]. I reviewed her allergies, medications and Medical/Surgical/OB history.    Patient Active Problem List   Diagnosis Date Noted   HSV-2 infection complicating pregnancy 10/14/2023   Cystic fibrosis carrier, antepartum 09/04/2023   AMA (advanced maternal age) multigravida 35+, first trimester 08/08/2023   Obesity complicating pregnancy, unspecified trimester 04/19/2017   Rubella non-immune status, antepartum 04/19/2017   BMI 50.0-59.9, adult (HCC) 03/12/2017    Patient advised of first appointment in our office and when to arrive.   All questions were answered.

## 2023-10-16 ENCOUNTER — Other Ambulatory Visit: Payer: Self-pay

## 2023-10-16 ENCOUNTER — Ambulatory Visit: Payer: Managed Care, Other (non HMO) | Admitting: Maternal & Fetal Medicine

## 2023-10-16 ENCOUNTER — Ambulatory Visit: Payer: Managed Care, Other (non HMO) | Attending: Maternal & Fetal Medicine

## 2023-10-16 VITALS — BP 130/90 | HR 97

## 2023-10-16 DIAGNOSIS — O09521 Supervision of elderly multigravida, first trimester: Secondary | ICD-10-CM

## 2023-10-16 DIAGNOSIS — O99210 Obesity complicating pregnancy, unspecified trimester: Secondary | ICD-10-CM

## 2023-10-16 DIAGNOSIS — O09522 Supervision of elderly multigravida, second trimester: Secondary | ICD-10-CM | POA: Diagnosis not present

## 2023-10-16 DIAGNOSIS — Z363 Encounter for antenatal screening for malformations: Secondary | ICD-10-CM | POA: Diagnosis not present

## 2023-10-16 DIAGNOSIS — O285 Abnormal chromosomal and genetic finding on antenatal screening of mother: Secondary | ICD-10-CM

## 2023-10-16 DIAGNOSIS — O0991 Supervision of high risk pregnancy, unspecified, first trimester: Secondary | ICD-10-CM

## 2023-10-16 DIAGNOSIS — O09892 Supervision of other high risk pregnancies, second trimester: Secondary | ICD-10-CM | POA: Diagnosis not present

## 2023-10-16 DIAGNOSIS — Z148 Genetic carrier of other disease: Secondary | ICD-10-CM | POA: Diagnosis not present

## 2023-10-16 DIAGNOSIS — Z3A19 19 weeks gestation of pregnancy: Secondary | ICD-10-CM

## 2023-10-16 DIAGNOSIS — E669 Obesity, unspecified: Secondary | ICD-10-CM | POA: Diagnosis not present

## 2023-10-16 DIAGNOSIS — Z141 Cystic fibrosis carrier: Secondary | ICD-10-CM | POA: Insufficient documentation

## 2023-10-16 DIAGNOSIS — B009 Herpesviral infection, unspecified: Secondary | ICD-10-CM

## 2023-10-16 DIAGNOSIS — O09899 Supervision of other high risk pregnancies, unspecified trimester: Secondary | ICD-10-CM

## 2023-10-16 DIAGNOSIS — Z6841 Body Mass Index (BMI) 40.0 and over, adult: Secondary | ICD-10-CM

## 2023-10-16 DIAGNOSIS — O99212 Obesity complicating pregnancy, second trimester: Secondary | ICD-10-CM | POA: Diagnosis not present

## 2023-10-16 NOTE — Progress Notes (Addendum)
 MFM Consultation  Ms. Girten is a 38 yo G5P2 who is seen at 19w 5d with an EDD of 03/06/24.   She seen at the request of Margaretmary Eddy, CNM given maternal elevated BMI.  She is overall doing well today. She denies s/sx of preterm labor or preeclampsia.  She has a low risk NIPT. She has had an elevated 1hr GTT but a normal 3hr GTT. 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  TSH,HIV and hgbA1c are within normal range. Her baseline PIH labs are normal.     10/16/2023    2:08 PM 10/16/2023   12:46 PM 09/01/2023   11:24 AM  Vitals with BMI  Systolic 130 146 409  Diastolic 90 97 80  Pulse 97 128 110   OB History  Gravida Para Term Preterm AB Living  5 2 2  0 2 2  SAB IAB Ectopic Multiple Live Births  2 0 0 0 2    # Outcome Date GA Lbr Len/2nd Weight Sex Type Anes PTL Lv  5 Current           4 SAB 10/04/22          3 SAB 05/16/22          2 Term 09/17/17 [redacted]w[redacted]d  3232 g F Vag-Spont EPI N LIV     Name: Ancil Boozer  1 Term 10/18/11 [redacted]w[redacted]d   F Vag-Spont EPI  LIV     Name: Josselynn   Past Medical History:  Diagnosis Date   Anemia    Herpes genitalis in women    Past Surgical History:  Procedure Laterality Date   BIOPSY  06/26/2023   Procedure: BIOPSY;  Surgeon: Norma Fredrickson, Boykin Nearing, MD;  Location: [facility] ENDOSCOPY;  Service: Gastroenterology;;   COLONOSCOPY WITH PROPOFOL N/A 06/26/2023   Procedure: COLONOSCOPY WITH PROPOFOL;  Surgeon: Toledo, Boykin Nearing, MD;  Location: [facility] ENDOSCOPY;  Service: Gastroenterology;  Laterality: N/A;   NO PAST SURGERIES     Social History   Socioeconomic History   Marital status: Married    Spouse name: Not on file   Number of children: Not on file   Years of education: Not on file   Highest education level: Not on file  Occupational History   Not on file  Tobacco Use   Smoking status: Never    Passive exposure: Past   Smokeless tobacco: Never  Vaping Use   Vaping status: Never Used  Substance and Sexual Activity   Alcohol use: Not Currently    Drug use: No   Sexual activity: Yes    Partners: Male  Other Topics Concern   Not on file  Social History Narrative   Not on file   Social Drivers of Health   Financial Resource Strain: Low Risk  (08/22/2023)   Received from [facility]   Overall Financial Resource Strain (CARDIA)    Difficulty of Paying Living Expenses: Not very hard  Food Insecurity: No Food Insecurity (08/22/2023)   Received from [facility]   Hunger Vital Sign    Worried About Running Out of Food in the Last Year: Never true    Ran Out of Food in the Last Year: Never true  Transportation Needs: No Transportation Needs (08/22/2023)   Received from [facility] - Transportation    In the past 12 months, has lack of transportation kept you from medical appointments or from getting medications?: No    Lack of Transportation (  Non-Medical): No  Physical Activity: Not on file  Stress: Not on file  Social Connections: Not on file  Intimate Partner Violence: Not on file   Family History  Problem Relation Age of Onset   Thyroid disease Mother    Cancer - Cervical Mother    Heart disease Mother    Endometriosis Sister    Breast cancer Neg Hx    Ovarian cancer Neg Hx    Colon cancer Neg Hx    Diabetes Neg Hx    No Known Allergies     Current Outpatient Medications (Analgesics):    aspirin 81 MG chewable tablet, Chew by mouth.  Current Outpatient Medications (Hematological):    Ferrous Sulfate (IRON PO), Take 1 tablet by mouth daily. (Patient not taking: Reported on 10/14/2023)  Current Outpatient Medications (Other):    [employer] EVENING PRIMROSE OIL PO, Take by mouth.   FISH OIL-COENZYME Q10 PO, Take 1 tablet by mouth daily.   hyoscyamine (ANASPAZ) 0.125 MG TBDP disintergrating tablet, Place 0.125 mg under the tongue every 6 (six) hours as needed for cramping.  Imaging:  Single intrauterine pregnancy with measurements consistent with dates.  Good  fetal movement and amniotic fluid volume.  No markers of aneuploidy observed. Suboptimal views of the fetal anatomy was obtained secondary to fetal position and maternal habitus   Impression/Counseling:  Elevated BMI >40.  I discussed with Ms. Eiland the increased risk of gestational diabetes, preeclampsia, cesarean delivery, fetal macrosomia and post surgical thrombosis and infection.   We also discussed total weight gain of 11-20lbs for the pregnancy.  She is taking low dose ASA.  Lastly, we recommend serial growth exam every  4-6 weeks with plan for weekly testing at 34 weeks.  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 Willis husband had labs drawn for [facility] today. They will follow up with genetic counseling to discuss the implications of the results.  Lastly, Ms. Liss had an elevated diastolic at 90 mmHg. Today. I discussed the increased risk for gestational hypertension and preeclampsia. She will keep an eye on her blood pressure.  If there are multiple reading of > 140/90 consider the diagnosis of chronic hypertension. Her records show a blood pressure in 9/6 of 158/99.  All questions answered.  I spent 45 minutes with > 50% in face to face consultation  Novella Olive, MD

## 2023-10-17 ENCOUNTER — Encounter (HOSPITAL_COMMUNITY): Payer: Self-pay | Admitting: Maternal & Fetal Medicine

## 2023-10-24 ENCOUNTER — Telehealth: Payer: Self-pay

## 2023-10-24 NOTE — Telephone Encounter (Signed)
 I spoke with the patient to clarify her carrier screening results as she left a MyChart message asking for information. She was found to be a carrier for cystic fibrosis. She was NOT found to be a carrier for spinal muscular atrophy.  FOB's carrier screening is pending.  Sheppard Plumber, MS, [facility] Certified Genetic Counselor [facility] [phone]

## 2023-10-29 ENCOUNTER — Telehealth: Payer: Self-pay

## 2023-10-29 NOTE — Telephone Encounter (Signed)
 I called the FOB to discuss his carrier screening results Tanay Massiah DOB 02/06/1984). He was not found to be a carrier for SMA, CF, alpha thalassemia, or beta-hemoglobinopathies. Please see report for details. A negative result on carrier screening reduces but does not eliminate the chance of being a carrier. The chance this couple's current and future pregnancies would be affected with CF is very low.  Georgean Kindle, MS, [facility] Certified Genetic Counselor [facility] for Maternal Fetal [facility] [phone]

## 2023-11-16 ENCOUNTER — Other Ambulatory Visit: Payer: Self-pay

## 2023-11-16 ENCOUNTER — Observation Stay
Admission: EM | Admit: 2023-11-16 | Discharge: 2023-11-16 | Disposition: A | Discharge status: Home / Self Care | Attending: Emergency Medicine | Admitting: Emergency Medicine

## 2023-11-16 ENCOUNTER — Observation Stay

## 2023-11-16 DIAGNOSIS — Z3A24 24 weeks gestation of pregnancy: Secondary | ICD-10-CM | POA: Insufficient documentation

## 2023-11-16 DIAGNOSIS — E669 Obesity, unspecified: Secondary | ICD-10-CM | POA: Insufficient documentation

## 2023-11-16 DIAGNOSIS — O99212 Obesity complicating pregnancy, second trimester: Secondary | ICD-10-CM | POA: Diagnosis not present

## 2023-11-16 DIAGNOSIS — O9A212 Injury, poisoning and certain other consequences of external causes complicating pregnancy, second trimester: Principal | ICD-10-CM | POA: Insufficient documentation

## 2023-11-16 DIAGNOSIS — M545 Low back pain, unspecified: Secondary | ICD-10-CM | POA: Diagnosis not present

## 2023-11-16 DIAGNOSIS — O26892 Other specified pregnancy related conditions, second trimester: Secondary | ICD-10-CM | POA: Diagnosis not present

## 2023-11-16 HISTORY — DX: Obesity, unspecified: E66.9

## 2023-11-16 LAB — CBC
HCT: 33.5 % — ABNORMAL LOW (ref 36.0–46.0)
Hemoglobin: 11.3 g/dL — ABNORMAL LOW (ref 12.0–15.0)
MCH: 31 pg (ref 26.0–34.0)
MCHC: 33.7 g/dL (ref 30.0–36.0)
MCV: 91.8 fL (ref 80.0–100.0)
Platelets: 213 10*3/uL (ref 150–400)
RBC: 3.65 MIL/uL — ABNORMAL LOW (ref 3.87–5.11)
RDW: 14.6 % (ref 11.5–15.5)
WBC: 8.9 10*3/uL (ref 4.0–10.5)
nRBC: 0 % (ref 0.0–0.2)

## 2023-11-16 LAB — TYPE AND SCREEN
ABO/RH(D): O POS
Antibody Screen: NEGATIVE

## 2023-11-16 MED ORDER — ACETAMINOPHEN 500 MG PO TABS: 1000.0000 mg | ORAL_TABLET | Freq: Once | ORAL | Status: DC

## 2023-11-16 NOTE — OB Triage Note (Signed)
Discharge home. Left floor ambulatory. Nirav Sweda S  
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
## 2023-11-18 ENCOUNTER — Encounter: Payer: Self-pay | Admitting: *Deleted

## 2023-11-27 ENCOUNTER — Other Ambulatory Visit: Payer: Self-pay

## 2023-11-27 ENCOUNTER — Ambulatory Visit: Attending: Maternal & Fetal Medicine

## 2023-11-27 DIAGNOSIS — E669 Obesity, unspecified: Secondary | ICD-10-CM | POA: Diagnosis not present

## 2023-11-27 DIAGNOSIS — Z3A25 25 weeks gestation of pregnancy: Secondary | ICD-10-CM | POA: Insufficient documentation

## 2023-11-27 DIAGNOSIS — Z362 Encounter for other antenatal screening follow-up: Secondary | ICD-10-CM | POA: Insufficient documentation

## 2023-11-27 DIAGNOSIS — O09521 Supervision of elderly multigravida, first trimester: Secondary | ICD-10-CM

## 2023-11-27 DIAGNOSIS — Z141 Cystic fibrosis carrier: Secondary | ICD-10-CM

## 2023-11-27 DIAGNOSIS — O09522 Supervision of elderly multigravida, second trimester: Secondary | ICD-10-CM | POA: Diagnosis present

## 2023-11-27 DIAGNOSIS — O99212 Obesity complicating pregnancy, second trimester: Secondary | ICD-10-CM | POA: Insufficient documentation

## 2023-11-27 DIAGNOSIS — O99210 Obesity complicating pregnancy, unspecified trimester: Secondary | ICD-10-CM

## 2023-11-29 ENCOUNTER — Other Ambulatory Visit: Payer: Self-pay

## 2023-11-29 DIAGNOSIS — D649 Anemia, unspecified: Secondary | ICD-10-CM | POA: Diagnosis not present

## 2023-11-29 DIAGNOSIS — K625 Hemorrhage of anus and rectum: Secondary | ICD-10-CM | POA: Diagnosis present

## 2023-11-29 DIAGNOSIS — R197 Diarrhea, unspecified: Secondary | ICD-10-CM | POA: Insufficient documentation

## 2023-11-29 LAB — CBC
HCT: 34.1 % — ABNORMAL LOW (ref 36.0–46.0)
Hemoglobin: 11.3 g/dL — ABNORMAL LOW (ref 12.0–15.0)
MCH: 30.5 pg (ref 26.0–34.0)
MCHC: 33.1 g/dL (ref 30.0–36.0)
MCV: 92.2 fL (ref 80.0–100.0)
Platelets: 196 10*3/uL (ref 150–400)
RBC: 3.7 MIL/uL — ABNORMAL LOW (ref 3.87–5.11)
RDW: 14.8 % (ref 11.5–15.5)
WBC: 7.9 10*3/uL (ref 4.0–10.5)
nRBC: 0 % (ref 0.0–0.2)

## 2023-11-29 LAB — COMPREHENSIVE METABOLIC PANEL WITH GFR
ALT: 14 U/L (ref 0–44)
AST: 20 U/L (ref 15–41)
Albumin: 2.7 g/dL — ABNORMAL LOW (ref 3.5–5.0)
Alkaline Phosphatase: 79 U/L (ref 38–126)
Anion gap: 9 (ref 5–15)
BUN: 7 mg/dL (ref 6–20)
CO2: 18 mmol/L — ABNORMAL LOW (ref 22–32)
Calcium: 8.5 mg/dL — ABNORMAL LOW (ref 8.9–10.3)
Chloride: 104 mmol/L (ref 98–111)
Creatinine, Ser: 0.64 mg/dL (ref 0.44–1.00)
GFR, Estimated: >60 mL/min (ref >60)
Glucose, Bld: 161 mg/dL — ABNORMAL HIGH (ref 70–99)
Potassium: 3.4 mmol/L — ABNORMAL LOW (ref 3.5–5.1)
Sodium: 131 mmol/L — ABNORMAL LOW (ref 135–145)
Total Bilirubin: 0.5 mg/dL (ref 0.0–1.2)
Total Protein: 6.7 g/dL (ref 6.5–8.1)

## 2023-11-29 LAB — URINALYSIS, ROUTINE W REFLEX MICROSCOPIC
Bilirubin Urine: NEGATIVE
Glucose, UA: 50 mg/dL — AB
Hgb urine dipstick: NEGATIVE
Ketones, ur: 5 mg/dL — AB
Leukocytes,Ua: NEGATIVE
Nitrite: NEGATIVE
Protein, ur: 30 mg/dL — AB
Specific Gravity, Urine: 1.024 (ref 1.005–1.030)
pH: 5 (ref 5.0–8.0)

## 2023-11-29 LAB — LIPASE, BLOOD: Lipase: 27 U/L (ref 11–51)

## 2023-11-29 NOTE — ED Triage Notes (Signed)
 Pt reports she has had small amount of abd pain and diarrhea for the past 2 days, pt reports today she has had 1 episode of blood in her diarrhea. Pt reports she is [redacted] weeks pregnant, high risk due to pts BMI and her BP. Pt reports she is feeling baby move.

## 2023-11-30 ENCOUNTER — Emergency Department
Admission: EM | Admit: 2023-11-30 | Discharge: 2023-11-30 | Disposition: A | Discharge status: Home / Self Care | Attending: Emergency Medicine | Admitting: Emergency Medicine

## 2023-11-30 DIAGNOSIS — R1110 Vomiting, unspecified: Secondary | ICD-10-CM

## 2023-11-30 DIAGNOSIS — K625 Hemorrhage of anus and rectum: Secondary | ICD-10-CM

## 2023-11-30 MED ORDER — LACTATED RINGERS IV BOLUS
1000.0000 mL | Freq: Once | INTRAVENOUS | Status: AC
  Administered 2023-11-30: 1000 mL via INTRAVENOUS

## 2023-11-30 NOTE — ED Provider Notes (Signed)
 [facility] Provider Note    Event Date/Time   First MD Initiated Contact with Patient 11/30/23 0113     (approximate)   History   Rectal Bleeding   HPI  Nancy Sloan is a 38 year old female ending to the emergency department for evaluation of abdominal pain and diarrhea.  Over the past 2 days patient has had occasional nonbloody vomiting and frequent episodes of diarrhea.  Today she noticed a small amount of pink tinge and then a small amount of bright red blood in her stools.  Reports mild abdominal cramping.  Denies vaginal bleeding, loss of fluid, contraction-like pain.  Reports congestion over the past few days, denies fevers.     Physical Exam   Triage Vital Signs: ED Triage Vitals  Encounter Vitals Group     BP 11/29/23 2016 (!) 131/91     Systolic BP Percentile --      Diastolic BP Percentile --      Pulse Rate 11/29/23 2016 (!) 130     Resp 11/29/23 2016 20     Temp 11/29/23 2016 97.9 F (36.6 C)     Temp Source 11/29/23 2016 Oral     SpO2 11/29/23 2016 98 %     Weight 11/29/23 2015 (!) 340 lb (154.2 kg)     Height 11/29/23 2015 5\' 4"  (1.626 m)     Head Circumference --      Peak Flow --      Pain Score 11/29/23 2015 0     Pain Loc --      Pain Education --      Exclude from Growth Chart --     Most recent vital signs: Vitals:   11/30/23 0230 11/30/23 0315  BP: 109/67   Pulse: (!) 106 (!) 108  Resp: (!) 24 (!) 25  Temp:    SpO2: 100% 100%     General: Awake, interactive  CV:  Regular rate, good peripheral perfusion.  Resp:  Unlabored respirations, lungs clear to auscultation Abd:  Nondistended, soft, no significant tenderness to palpation, brown stool on rectal exam, no visible active bleeding, hemorrhoids Neuro:  Symmetric facial movement, fluid speech   ED Results / Procedures / Treatments   Labs (all labs ordered are listed, but only abnormal results are displayed) Labs Reviewed  COMPREHENSIVE METABOLIC  PANEL WITH GFR - Abnormal; Notable for the following components:      Result Value   Sodium 131 (*)    Potassium 3.4 (*)    CO2 18 (*)    Glucose, Bld 161 (*)    Calcium 8.5 (*)    Albumin 2.7 (*)    All other components within normal limits  CBC - Abnormal; Notable for the following components:   RBC 3.70 (*)    Hemoglobin 11.3 (*)    HCT 34.1 (*)    All other components within normal limits  URINALYSIS, ROUTINE W REFLEX MICROSCOPIC - Abnormal; Notable for the following components:   Color, Urine YELLOW (*)    APPearance HAZY (*)    Glucose, UA 50 (*)    Ketones, ur 5 (*)    Protein, ur 30 (*)    Bacteria, UA RARE (*)    All other components within normal limits  LIPASE, BLOOD  POC URINE PREG, ED     EKG EKG independently reviewed interpreted by myself (ER attending) demonstrates:    RADIOLOGY Imaging independently reviewed and interpreted by myself demonstrates:   Formal Radiology Read:  No results found.  PROCEDURES:  Critical Care performed: No  Procedures   MEDICATIONS ORDERED IN ED: Medications  lactated ringers bolus 1,000 mL (0 mLs Intravenous Stopped 11/30/23 0315)     IMPRESSION / MDM / ASSESSMENT AND PLAN / ED COURSE  I reviewed the triage vital signs and the nursing notes.  Differential diagnosis includes, but is not limited to, viral illness, electrolyte abnormality, anemia low suspicion acute intra-abdominal process given overall reassuring abdominal exam, lower suspicion hyperemesis given primarily diarrhea and stage of pregnancy  Patient's presentation is most consistent with acute presentation with potential threat to life or bodily function.  38 year old female presenting to the emergency department for evaluation of diarrhea.  Labs with stable anemia.  Suspect small amount of lower GI bleeding in the setting of recurrent diarrhea, low suspicion significant GI bleed.  CMP with mild acidosis.  Normal lipase.  Urine overall not suggestive of  infection.  Small amount of ketones needed.  FHT confirmed.  Patient has noted to be tachycardic on presentation, improved at the time of my initial evaluation.  Did note that this has also been present on multiple prior visits including 5/14, 4/2, 2/16.  Did have some improvement after fluids.  Patient feels improved on reevaluation.  She is comfortable with discharge home.  Strict return cautions were provided for worsening abdominal pain, increased rectal bleeding, or any other new or concerning symptoms.  Patient was discharged in stable condition.      FINAL CLINICAL IMPRESSION(S) / ED DIAGNOSES   Final diagnoses:  Vomiting and diarrhea  Rectal bleeding     Rx / DC Orders   ED Discharge Orders     None        Note:  This document was prepared using Dragon voice recognition software and may include unintentional dictation errors.   Claria Crofts, MD 11/30/23 [phone]

## 2023-11-30 NOTE — Discharge Instructions (Signed)
 Try and stay hydrated is much as possible.  Follow-up with your OB/GYN for further evaluation.  Return to the ER for any new or worsening symptoms including worsening abdominal pain, worsening rectal bleeding, or any other new or concerning symptoms.

## 2023-12-06 ENCOUNTER — Observation Stay
Admission: EM | Admit: 2023-12-06 | Discharge: 2023-12-06 | Disposition: A | Discharge status: Home / Self Care | Attending: Obstetrics and Gynecology | Admitting: Obstetrics and Gynecology

## 2023-12-06 DIAGNOSIS — E669 Obesity, unspecified: Secondary | ICD-10-CM | POA: Insufficient documentation

## 2023-12-06 DIAGNOSIS — O99212 Obesity complicating pregnancy, second trimester: Secondary | ICD-10-CM | POA: Insufficient documentation

## 2023-12-06 DIAGNOSIS — O10012 Pre-existing essential hypertension complicating pregnancy, second trimester: Secondary | ICD-10-CM | POA: Insufficient documentation

## 2023-12-06 DIAGNOSIS — Z6841 Body Mass Index (BMI) 40.0 and over, adult: Secondary | ICD-10-CM | POA: Insufficient documentation

## 2023-12-06 DIAGNOSIS — O368190 Decreased fetal movements, unspecified trimester, not applicable or unspecified: Principal | ICD-10-CM | POA: Diagnosis present

## 2023-12-06 DIAGNOSIS — O368120 Decreased fetal movements, second trimester, not applicable or unspecified: Principal | ICD-10-CM | POA: Insufficient documentation

## 2023-12-06 DIAGNOSIS — Z3A27 27 weeks gestation of pregnancy: Secondary | ICD-10-CM | POA: Diagnosis not present

## 2023-12-06 NOTE — OB Triage Note (Signed)
 Pt arrived from home with c/o decreased fetal movement throughout the day. She has felt some movement but not as much as normally. Pt is feeling movement during monitoring. Denies any UCs or other complaints. FHR 130

## 2023-12-06 NOTE — Discharge Summary (Signed)
 Patient ID: Artesha Wemhoff MRN: [id number] DOB/AGE: Sep 21, 1985 38 y.o.  Admit date: 12/06/2023 Discharge date: 12/06/2023  Admission Diagnoses: 38yo G5P2 at [redacted]w[redacted]d presents with decreased fetal movement.  She reports on and off movement all day.   Discharge Diagnoses: Pt reports increased fetal movement since admission  Factors complicating pregnancy: CHTN Obesity- BMI at NOB 55 CF Carrier - Positive CF carrier screen AMA  Rubella non-immune- Recommend MMR postpartum  Tachycardia    Prenatal Procedures: fetal monitoring   Consults: None  Significant Diagnostic Studies: None   Treatments: none  Hospital Course:  This is a 38 y.o. [id number] with IUP at [redacted]w[redacted]d seen for decreased fetal movement.  She was observed, fetal heart rate monitoring remained reassuring, and she had no signs/symptoms of  labor or other maternal-fetal concerns.  She was deemed stable for discharge to home with outpatient follow up.  Discharge Physical Exam:  BP (!) 117/45   Pulse 89   Temp 98.1 F (36.7 C) (Oral)   Resp 16   LMP 02/11/2023 (Exact Date)   General: NAD CV: RRR Pulm: nl effort ABD: s/nd/nt, gravid DVT Evaluation: LE non-ttp, no evidence of DVT on exam.  NST: Appropriate for GA  TOCO: quiet SVE: deferred      Discharge Condition: Stable  Disposition:  Discharge disposition: 01-Home or Self Care        Allergies as of 12/06/2023   No Known Allergies      Medication List     TAKE these medications    aspirin 81 MG chewable tablet Chew by mouth.   FISH OIL-COENZYME Q10 PO Take 1 tablet by mouth daily.   hyoscyamine 0.125 MG Tbdp disintergrating tablet Commonly known as: ANASPAZ Place 0.125 mg under the tongue every 6 (six) hours as needed for cramping.   multivitamin-prenatal 27-0.8 MG Tabs tablet Take 1 tablet by mouth daily at 12 noon.         SignedCollin Deal, CNM 12/06/2023 7:42 PM

## 2024-01-01 ENCOUNTER — Ambulatory Visit

## 2024-01-06 ENCOUNTER — Other Ambulatory Visit: Payer: Self-pay

## 2024-01-06 ENCOUNTER — Ambulatory Visit: Attending: Maternal & Fetal Medicine

## 2024-01-06 ENCOUNTER — Ambulatory Visit (HOSPITAL_BASED_OUTPATIENT_CLINIC_OR_DEPARTMENT_OTHER): Admitting: Obstetrics and Gynecology

## 2024-01-06 DIAGNOSIS — O99212 Obesity complicating pregnancy, second trimester: Secondary | ICD-10-CM | POA: Diagnosis not present

## 2024-01-06 DIAGNOSIS — E66813 Obesity, class 3: Secondary | ICD-10-CM | POA: Diagnosis not present

## 2024-01-06 DIAGNOSIS — O09522 Supervision of elderly multigravida, second trimester: Secondary | ICD-10-CM | POA: Insufficient documentation

## 2024-01-06 DIAGNOSIS — E6689 Other obesity not elsewhere classified: Secondary | ICD-10-CM | POA: Diagnosis not present

## 2024-01-06 DIAGNOSIS — O09892 Supervision of other high risk pregnancies, second trimester: Secondary | ICD-10-CM | POA: Insufficient documentation

## 2024-01-06 DIAGNOSIS — Z3A25 25 weeks gestation of pregnancy: Secondary | ICD-10-CM | POA: Diagnosis not present

## 2024-01-06 DIAGNOSIS — E669 Obesity, unspecified: Secondary | ICD-10-CM

## 2024-01-06 DIAGNOSIS — Z148 Genetic carrier of other disease: Secondary | ICD-10-CM | POA: Diagnosis not present

## 2024-01-06 DIAGNOSIS — Z141 Cystic fibrosis carrier: Secondary | ICD-10-CM

## 2024-01-06 DIAGNOSIS — Z3A31 31 weeks gestation of pregnancy: Secondary | ICD-10-CM

## 2024-01-06 DIAGNOSIS — O99210 Obesity complicating pregnancy, unspecified trimester: Secondary | ICD-10-CM

## 2024-01-06 NOTE — Progress Notes (Signed)
 Maternal-Fetal Medicine   Name: Nancy Sloan MRN: [id number] G5 P2022 at 25w 5d gestation. History significant for 2 term vaginal deliveries. Fetal growth is appropriate for gestational age.  Amniotic fluid normal good fetal activity seen.  Pregravid BMI 56 Grade 3 obesity is independently associated with increased risk of stillbirth (2.5- to 3-fold), but the absolute risk is very small.  I discussed protocol of weekly antenatal testing from [redacted] weeks gestation until delivery. Maternal obesity imposes limitations on the resolution of images and fetal anomalies may be missed. I reassured the patient that previous vaginal deliveries increase the likelihood of successful vaginal delivery.  Recommendations - Fetal growth assessments every 4 weeks that may be performed at your office. - Weekly antenatal testing from [redacted] weeks gestation until delivery. - Delivery at 39 to 40 weeks may be considered if cervix is favorable.  Consultation including face-to-face counseling (more than 50% of time spent) is 20 minutes.

## 2024-01-13 ENCOUNTER — Observation Stay
Admission: RE | Admit: 2024-01-13 | Discharge: 2024-01-13 | Disposition: A | Discharge status: Home / Self Care | Attending: Obstetrics and Gynecology | Admitting: Obstetrics and Gynecology

## 2024-01-13 DIAGNOSIS — O99213 Obesity complicating pregnancy, third trimester: Secondary | ICD-10-CM | POA: Diagnosis present

## 2024-01-13 DIAGNOSIS — E669 Obesity, unspecified: Secondary | ICD-10-CM | POA: Insufficient documentation

## 2024-01-13 NOTE — Progress Notes (Signed)
 Discharge instructions provided to patient. Patient verbalized understanding. Pt educated on signs and symptoms of labor, vaginal bleeding, LOF, and fetal movement. Red flag signs reviewed by RN. Patient discharged home in stable condition.

## 2024-01-13 NOTE — Progress Notes (Signed)
   01/13/24 0948  Fetal Heart Rate A  Mode External  Baseline Rate (A) 135 bpm  Variability 6-25 BPM  Accelerations 15 x 15  Decelerations None  Uterine Activity  Mode Toco  Contraction Frequency (min) none noted

## 2024-01-13 NOTE — Discharge Summary (Signed)
 Presented for BMI 59 and NST Denies any issues, per RN  NST: Baseline FHR:  130 beats/min Variability: moderate Accelerations: present Decelerations: absent Tocometry: quiet  Interpretation:  INDICATIONS: obesity in pregnancy, third trimester, BMI 59 RESULTS:  A NST procedure was performed with FHR monitoring and a normal baseline established, appropriate time of 20-40 minutes of evaluation, and accels >2 seen w 15x15 characteristics.  Results show a REACTIVE NST.    Follow up as already scheduled. Has clinic appointment today for routine prenatal appointment.   Garnette Mace, MD, [facility] 01/13/2024 9:45 AM

## 2024-01-13 NOTE — OB Triage Note (Signed)
 Nancy Sloan is a 37yo G5P2 at [redacted]w[redacted]d here for her scheduled NST d/t elevated BMI. She has no new complaints at this time. She denies any regular or consistent contractions, VB, or LOF. She notes +FM. VSS. She states she has a ROB appt today following this NST. Monitors applied and assessing.

## 2024-01-20 ENCOUNTER — Observation Stay
Admission: RE | Admit: 2024-01-20 | Discharge: 2024-01-20 | Disposition: A | Discharge status: Home / Self Care | Attending: Obstetrics and Gynecology | Admitting: Obstetrics and Gynecology

## 2024-01-20 ENCOUNTER — Other Ambulatory Visit: Payer: Self-pay

## 2024-01-20 ENCOUNTER — Encounter: Payer: Self-pay | Admitting: Obstetrics and Gynecology

## 2024-01-20 DIAGNOSIS — O0001 Abdominal pregnancy with intrauterine pregnancy: Secondary | ICD-10-CM | POA: Insufficient documentation

## 2024-01-20 DIAGNOSIS — O133 Gestational [pregnancy-induced] hypertension without significant proteinuria, third trimester: Secondary | ICD-10-CM | POA: Insufficient documentation

## 2024-01-20 DIAGNOSIS — Z3A33 33 weeks gestation of pregnancy: Secondary | ICD-10-CM | POA: Insufficient documentation

## 2024-01-20 DIAGNOSIS — O0990 Supervision of high risk pregnancy, unspecified, unspecified trimester: Principal | ICD-10-CM

## 2024-01-20 DIAGNOSIS — O0993 Supervision of high risk pregnancy, unspecified, third trimester: Principal | ICD-10-CM | POA: Insufficient documentation

## 2024-01-20 DIAGNOSIS — O368330 Maternal care for abnormalities of the fetal heart rate or rhythm, third trimester, not applicable or unspecified: Secondary | ICD-10-CM | POA: Diagnosis not present

## 2024-01-20 MED ORDER — ACETAMINOPHEN 500 MG PO TABS: 1000.0000 mg | ORAL_TABLET | Freq: Four times a day (QID) | ORAL | Status: DC | PRN

## 2024-01-20 MED ORDER — CALCIUM CARBONATE ANTACID 500 MG PO CHEW: 2.0000 | CHEWABLE_TABLET | ORAL | Status: DC | PRN

## 2024-01-20 NOTE — OB Triage Note (Signed)
 Pt presents for scheduled NST for BMI. +FM. Denies LOF,bleeding, ctx. No concerns.
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
## 2024-01-27 ENCOUNTER — Other Ambulatory Visit: Payer: Self-pay

## 2024-01-27 ENCOUNTER — Observation Stay
Admission: RE | Admit: 2024-01-27 | Discharge: 2024-01-27 | Disposition: A | Discharge status: Home / Self Care | Attending: Obstetrics and Gynecology | Admitting: Obstetrics and Gynecology

## 2024-01-27 ENCOUNTER — Encounter: Payer: Self-pay | Admitting: Obstetrics and Gynecology

## 2024-01-27 ENCOUNTER — Encounter: Payer: Self-pay | Admitting: Obstetrics

## 2024-01-27 DIAGNOSIS — O0001 Abdominal pregnancy with intrauterine pregnancy: Secondary | ICD-10-CM | POA: Diagnosis not present

## 2024-01-27 DIAGNOSIS — Z7982 Long term (current) use of aspirin: Secondary | ICD-10-CM | POA: Insufficient documentation

## 2024-01-27 DIAGNOSIS — E669 Obesity, unspecified: Secondary | ICD-10-CM | POA: Diagnosis not present

## 2024-01-27 DIAGNOSIS — O169 Unspecified maternal hypertension, unspecified trimester: Secondary | ICD-10-CM | POA: Diagnosis not present

## 2024-01-27 DIAGNOSIS — O99213 Obesity complicating pregnancy, third trimester: Secondary | ICD-10-CM | POA: Diagnosis not present

## 2024-01-27 DIAGNOSIS — O368330 Maternal care for abnormalities of the fetal heart rate or rhythm, third trimester, not applicable or unspecified: Principal | ICD-10-CM | POA: Insufficient documentation

## 2024-01-27 DIAGNOSIS — Z3A33 33 weeks gestation of pregnancy: Secondary | ICD-10-CM | POA: Diagnosis not present

## 2024-01-27 DIAGNOSIS — Z3689 Encounter for other specified antenatal screening: Principal | ICD-10-CM
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
## 2024-01-27 NOTE — OB Triage Note (Signed)
 Pt here for scheduled NST

## 2024-02-03 ENCOUNTER — Observation Stay
Admission: RE | Admit: 2024-02-03 | Discharge: 2024-02-03 | Disposition: A | Discharge status: Home / Self Care | Attending: Obstetrics and Gynecology | Admitting: Obstetrics and Gynecology

## 2024-02-03 DIAGNOSIS — Z7982 Long term (current) use of aspirin: Secondary | ICD-10-CM | POA: Diagnosis not present

## 2024-02-03 DIAGNOSIS — D649 Anemia, unspecified: Secondary | ICD-10-CM | POA: Insufficient documentation

## 2024-02-03 DIAGNOSIS — Z3689 Encounter for other specified antenatal screening: Secondary | ICD-10-CM | POA: Diagnosis not present

## 2024-02-03 DIAGNOSIS — O99012 Anemia complicating pregnancy, second trimester: Secondary | ICD-10-CM | POA: Diagnosis not present

## 2024-02-03 DIAGNOSIS — E669 Obesity, unspecified: Secondary | ICD-10-CM | POA: Insufficient documentation

## 2024-02-03 DIAGNOSIS — O10013 Pre-existing essential hypertension complicating pregnancy, third trimester: Secondary | ICD-10-CM | POA: Diagnosis present

## 2024-02-03 DIAGNOSIS — Z3A35 35 weeks gestation of pregnancy: Secondary | ICD-10-CM | POA: Insufficient documentation

## 2024-02-03 DIAGNOSIS — O99213 Obesity complicating pregnancy, third trimester: Secondary | ICD-10-CM | POA: Insufficient documentation

## 2024-02-03 MED ORDER — LACTATED RINGERS IV SOLN: 125.0000 mL/h | INTRAVENOUS | Status: DC

## 2024-02-03 MED ORDER — ACETAMINOPHEN 325 MG PO TABS: 650.0000 mg | ORAL_TABLET | ORAL | Status: DC | PRN

## 2024-02-03 MED ORDER — DOCUSATE SODIUM 100 MG PO CAPS: 100.0000 mg | ORAL_CAPSULE | Freq: Every day | ORAL | Status: DC

## 2024-02-03 MED ORDER — PRENATAL MULTIVITAMIN CH
1.0000 | ORAL_TABLET | Freq: Every day | ORAL | Status: DC
  Filled 2024-02-03: qty 1

## 2024-02-03 MED ORDER — CALCIUM CARBONATE ANTACID 500 MG PO CHEW: 2.0000 | CHEWABLE_TABLET | ORAL | Status: DC | PRN

## 2024-02-03 MED ORDER — ZOLPIDEM TARTRATE 5 MG PO TABS: 5.0000 mg | ORAL_TABLET | Freq: Every evening | ORAL | Status: DC | PRN

## 2024-02-03 NOTE — OB Triage Note (Signed)
 Patient is a 38 yo, G5P2, at 35 weeks 3 days. Patient presents for scheduled NST.  Patient denies any vaginal bleeding or LOF. Patient reports FM. Monitors applied and assessing. VSS. Initial fetal heart tone 135. Denies questions or concerns at this time.
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
## 2024-02-03 NOTE — OB Triage Note (Signed)
 Patient discharged by CNM. Discharge instructions and return precautions discussed, patient verbalizes understanding to all and denies questions and concerns. VSS, FHT 135. Patient ambulated off unit, discharge instructions in hand.
